# Patient Record
Sex: Female | Born: 1990 | Race: White | Hispanic: No | Marital: Married | Smoking: Never smoker
Health system: Southern US, Community
[De-identification: ages and names within clinical notes are randomized; demographics above are authoritative.]

## PROBLEM LIST (undated history)

## (undated) DIAGNOSIS — R87619 Unspecified abnormal cytological findings in specimens from cervix uteri: Secondary | ICD-10-CM

## (undated) DIAGNOSIS — M62838 Other muscle spasm: Secondary | ICD-10-CM

## (undated) DIAGNOSIS — G43909 Migraine, unspecified, not intractable, without status migrainosus: Secondary | ICD-10-CM

## (undated) DIAGNOSIS — T7840XA Allergy, unspecified, initial encounter: Secondary | ICD-10-CM

## (undated) DIAGNOSIS — F419 Anxiety disorder, unspecified: Secondary | ICD-10-CM

## (undated) HISTORY — PX: TYMPANOSTOMY TUBE PLACEMENT: SHX32

## (undated) HISTORY — DX: Migraine, unspecified, not intractable, without status migrainosus: G43.909

## (undated) HISTORY — DX: Other muscle spasm: M62.838

## (undated) HISTORY — DX: Anxiety disorder, unspecified: F41.9

## (undated) HISTORY — PX: HERNIA REPAIR: SHX51

## (undated) HISTORY — DX: Allergy, unspecified, initial encounter: T78.40XA

## (undated) HISTORY — DX: Unspecified abnormal cytological findings in specimens from cervix uteri: R87.619

---

## 2012-08-24 ENCOUNTER — Ambulatory Visit (INDEPENDENT_AMBULATORY_CARE_PROVIDER_SITE_OTHER): Payer: BC Managed Care – PPO | Admitting: Family Medicine

## 2012-08-24 VITALS — BP 120/88 | HR 86 | Temp 98.5°F | Resp 16 | Ht 65.0 in | Wt 121.0 lb

## 2012-08-24 DIAGNOSIS — M542 Cervicalgia: Secondary | ICD-10-CM

## 2012-08-24 DIAGNOSIS — M62838 Other muscle spasm: Secondary | ICD-10-CM

## 2012-08-24 MED ORDER — MELOXICAM 15 MG PO TABS: ORAL_TABLET | ORAL | Status: DC

## 2012-08-24 MED ORDER — KETOROLAC TROMETHAMINE 30 MG/ML IJ SOLN
30.0000 mg | Freq: Once | INTRAMUSCULAR | Status: AC
  Administered 2012-08-24: 30 mg via INTRAMUSCULAR

## 2012-08-24 MED ORDER — CYCLOBENZAPRINE HCL 10 MG PO TABS: 10.0000 mg | ORAL_TABLET | Freq: Two times a day (BID) | ORAL | Status: DC | PRN

## 2012-08-24 NOTE — Progress Notes (Signed)
Chief complaint: Neck pain  History of present illness: This is a 22 year old female in no significant past medical history coming in with neck pain. Patient has had this pain now for 4 days duration. The patient does not remember any type of specific injury but is having more stress at school this. Patient says that the pain is mostly on the left side without any radiation. Patient usually goes to a chiropractor but an attempt to do this and it did not make any improvement and actually potentially make it worse. Patient denies any weakness but unfortunately cannot move her head at all. Patient denies any visual changes, any headache, having nausea vomiting or any recent illnesses.  Past Medical History  Diagnosis Date  . Allergy     Past Surgical History  Procedure Laterality Date  . Hernia repair      History reviewed. No pertinent family history.  History  Substance Use Topics  . Smoking status: Never Smoker   . Smokeless tobacco: Not on file  . Alcohol Use: Not on file   Physical exam Blood pressure 120/88, pulse 86, temperature 98.5 F (36.9 C), temperature source Oral, resp. rate 16, height 5\' 5"  (1.651 m), weight 121 lb (54.885 kg), last menstrual period 08/19/2012, SpO2 98.00%. General: No apparent distress alert and oriented x3 mood and affect normal Respiratory: Patient's speak in full sentences and does not appear short of breath Skin: Warm dry intact with no signs of infection or rash Neuro: Cranial nerves II through XII are intact, neurovascularly intact in all extremities with 2+ DTRs and 2+ pulses. Neck: Negative spurling's Restricted in all planes Very tight paraspinal musculature on left side of cervical region.  Grip strength and sensation normal in bilateral hands Strength good C4 to T1 distribution No sensory change to C4 to T1 Reflexes normal No spinous process tenderness.   Assessment: Neck spasm  Plan: Portal 30 mg IM today Home exercise program  given The patient also given a prescription for blocks again and Flexeril for one week duration. Patient will return in one week if not better. At that time I would get cervical neck x-rays the patient was now presenting with any true radiculopathy. I do think there was some other psychosocial aspect to her pain and may need to be addressed later.

## 2012-08-24 NOTE — Patient Instructions (Signed)
Very nice to meet you I in getting you the medicine called meloxicam. Take one pill daily for the next week and as needed thereafter. Stopped his medication if it hurt her stomach. I am giving you some exercises. I would like you to do these daily. I am also giving you a muscle relaxant that you can take 2 times daily but will make you sleepy.  Come back in 1 week if not better.

## 2013-01-19 ENCOUNTER — Encounter: Payer: Self-pay | Admitting: Obstetrics & Gynecology

## 2013-01-19 ENCOUNTER — Ambulatory Visit (INDEPENDENT_AMBULATORY_CARE_PROVIDER_SITE_OTHER): Payer: BC Managed Care – PPO | Admitting: Obstetrics & Gynecology

## 2013-01-19 VITALS — BP 118/80 | HR 72 | Resp 16 | Ht 63.75 in | Wt 126.4 lb

## 2013-01-19 DIAGNOSIS — Z01419 Encounter for gynecological examination (general) (routine) without abnormal findings: Secondary | ICD-10-CM

## 2013-01-19 DIAGNOSIS — Z0000 Encounter for general adult medical examination without abnormal findings: Secondary | ICD-10-CM

## 2013-01-19 DIAGNOSIS — M62838 Other muscle spasm: Secondary | ICD-10-CM

## 2013-01-19 LAB — POCT URINALYSIS DIPSTICK
Blood, UA: NEGATIVE
Glucose, UA: NEGATIVE
Ketones, UA: NEGATIVE
Urobilinogen, UA: NEGATIVE

## 2013-01-19 MED ORDER — YASMIN 28 3-0.03 MG PO TABS: 1.0000 | ORAL_TABLET | Freq: Every day | ORAL | Status: DC

## 2013-01-19 MED ORDER — CYCLOBENZAPRINE HCL 10 MG PO TABS: 10.0000 mg | ORAL_TABLET | Freq: Three times a day (TID) | ORAL | Status: DC | PRN

## 2013-01-19 NOTE — Progress Notes (Signed)
22 y.o. G0P0000 Single [place] F here for annual exam.  Doing well.  Cycles regular.  Needs to be on Branded pill.  Finished Gardisil 2011.  Reports that she is having lots of issues with neck and back muscle spasms.  Saw DO at urgent care.  Saw ortho who didn't prescribe anything else.  Has done one massage that helped.  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  Not sexually active.     Patient's last menstrual period was 01/16/2013.          Sexually active: no  The current method of family planning is OCP (estrogen/progesterone).    Exercising: yes  walking and eliptical Smoker:  no  Health Maintenance: Pap:  none History of abnormal Pap:  no MMG:  none Colonoscopy:  none BMD:   none TDaP:  2007 Screening Labs: n/a, Hb today: n/a, Urine today: negative   reports that she has never smoked. She has never used smokeless tobacco. She reports that she does not drink alcohol or use illicit drugs.  Past Medical History  Diagnosis Date  . Allergy   . Migraine   . Muscle spasm     Past Surgical History  Procedure Laterality Date  . Hernia repair    . Tympanostomy tube placement      Current Outpatient Prescriptions  Medication Sig Dispense Refill  . diphenhydrAMINE (BENADRYL) 25 MG tablet Take 25 mg by mouth every 6 (six) hours as needed for itching.      . fexofenadine (ALLEGRA) 180 MG tablet Take 180 mg by mouth as needed.      Marland Kitchen YASMIN 28 3-0.03 MG tablet       . diazepam (VALIUM) 2 MG tablet 2 mg.       No current facility-administered medications for this visit.    Family History  Problem Relation Age of Onset  . Diabetes Father   . Diabetes Paternal Grandmother   . Diabetes Maternal Grandmother   . Diabetes Other     maternal great grandmother  . Heart attack Maternal Grandfather   . Stroke Maternal Grandmother   . Dementia Maternal Grandmother   . Thyroid disease Other     maternal side  . Hypertension Father   . Hyperlipidemia Father   . Migraines Mother   .  Hyperlipidemia Mother     ROS:  Pertinent items are noted in HPI.  Otherwise, a comprehensive ROS was negative.  Exam:   BP 118/80  Pulse 72  Resp 16  Ht 5' 3.75" (1.619 m)  Wt 126 lb 6.4 oz (57.335 kg)  BMI 21.87 kg/m2  LMP 01/16/2013  Weight change: -2lbs   Height: 5' 3.75" (161.9 cm)  Ht Readings from Last 3 Encounters:  01/19/13 5' 3.75" (1.619 m)  08/24/12 5\' 5"  (1.651 m)    General appearance: alert, cooperative and appears stated age Head: Normocephalic, without obvious abnormality, atraumatic Neck: no adenopathy, supple, symmetrical, trachea midline and thyroid normal to inspection and palpation Lungs: clear to auscultation bilaterally Breasts: normal appearance, no masses or tenderness Heart: regular rate and rhythm Abdomen: soft, non-tender; bowel sounds normal; no masses,  no organomegaly Extremities: extremities normal, atraumatic, no cyanosis or edema Skin: Skin color, texture, turgor normal. No rashes or lesions Lymph nodes: Cervical, supraclavicular, and axillary nodes normal. No abnormal inguinal nodes palpated Neurologic: Grossly normal   Pelvic: External genitalia:  no lesions              Urethra:  normal appearing urethra with no masses,  tenderness or lesions              Bartholins and Skenes: normal                 Vagina: normal appearing vagina with normal color and discharge, no lesions              Cervix: no lesions              Pap taken: no Bimanual Exam:  Uterus:  normal size, contour, position, consistency, mobility, non-tender              Adnexa: normal adnexa and no mass, fullness, tenderness               Rectovaginal: Confirms               Anus:  normal sphincter tone, no lesions  A:  Well Woman with normal exam On OCP's, not sexually active Neck/shoulder spasms--voice major/opera major  P:  Starting Pap smear next year Flexeril rx to pharmacy Referral to Dr. Ricki Melayna Robarts for acupuncture RX for Yasmin to pharmacy.  No substitutes. return  annually or prn  An After Visit Summary was printed and given to the patient.

## 2013-01-19 NOTE — Patient Instructions (Signed)

## 2013-09-26 ENCOUNTER — Telehealth: Payer: Self-pay | Admitting: Obstetrics & Gynecology

## 2013-09-26 MED ORDER — YASMIN 28 3-0.03 MG PO TABS: 1.0000 | ORAL_TABLET | Freq: Every day | ORAL | Status: DC

## 2013-09-26 NOTE — Telephone Encounter (Signed)
[facility], [place], [place] Patient wants to change Yasmin to a generic form.

## 2013-09-26 NOTE — Telephone Encounter (Signed)
Dr. Hyacinth MeekerMiller, patient was on Junel and experienced depression. Was using brand only Yasmin for acne after coming off of Junel.  Patient would like to try generic Yasmin as cost of brand yasmin has increased for her. Order entered with refills until 01/2014 with [facility]. Patient will call with any side effects immediatly.  [facility] will [facility] generic and they have correct order.   Routing to provider for final review. Patient agreeable to disposition. Will close encounter

## 2013-12-01 ENCOUNTER — Telehealth: Payer: Self-pay | Admitting: Obstetrics & Gynecology

## 2013-12-01 NOTE — Telephone Encounter (Signed)
Patient would like to talk to a nurse about changing her birth control.

## 2013-12-04 MED ORDER — YASMIN 28 3-0.03 MG PO TABS: 1.0000 | ORAL_TABLET | Freq: Every day | ORAL | Status: DC

## 2013-12-04 NOTE — Telephone Encounter (Signed)
Spoke with patient. Patient was previously on brand Yasmin but wanted to switch to generic due to cost. Patient would now like to switch back to brand as she states that the generic is "Making me sick and feel off. I did not feel like this on the brand. I want to switch back." Patient was switched to generic on 4/21. Requesting that rx be sent in to [facility] [address]. Advised would make change and send medication to requested pharmacy. Patient requesting vaccine for Hep A. Advised will need to see primary care for this shot. Patient agreeable and verbalizes understanding.  Routing to provider for final review. Patient agreeable to disposition. Will close encounter.

## 2013-12-04 NOTE — Telephone Encounter (Signed)
Left message to call Kaitlyn at [phone]. 

## 2014-01-26 ENCOUNTER — Ambulatory Visit (INDEPENDENT_AMBULATORY_CARE_PROVIDER_SITE_OTHER): Payer: BC Managed Care – PPO | Admitting: Obstetrics & Gynecology

## 2014-01-26 ENCOUNTER — Encounter: Payer: Self-pay | Admitting: Obstetrics & Gynecology

## 2014-01-26 VITALS — BP 120/80 | HR 60 | Resp 16 | Ht 64.0 in | Wt 123.8 lb

## 2014-01-26 DIAGNOSIS — Z01419 Encounter for gynecological examination (general) (routine) without abnormal findings: Secondary | ICD-10-CM

## 2014-01-26 DIAGNOSIS — M62838 Other muscle spasm: Secondary | ICD-10-CM

## 2014-01-26 DIAGNOSIS — Z124 Encounter for screening for malignant neoplasm of cervix: Secondary | ICD-10-CM

## 2014-01-26 DIAGNOSIS — Z0000 Encounter for general adult medical examination without abnormal findings: Secondary | ICD-10-CM

## 2014-01-26 LAB — HEMOGLOBIN, FINGERSTICK: Hemoglobin, fingerstick: 12.6 g/dL (ref 12.0–16.0)

## 2014-01-26 MED ORDER — ELETRIPTAN HYDROBROMIDE 40 MG PO TABS: ORAL_TABLET | ORAL | Status: DC

## 2014-01-26 MED ORDER — YASMIN 28 3-0.03 MG PO TABS: 1.0000 | ORAL_TABLET | Freq: Every day | ORAL | Status: DC

## 2014-01-26 MED ORDER — CYCLOBENZAPRINE HCL 10 MG PO TABS: 10.0000 mg | ORAL_TABLET | Freq: Three times a day (TID) | ORAL | Status: DC | PRN

## 2014-01-26 NOTE — Progress Notes (Addendum)
23 y.o. G0P0000 Single [place] F here for annual exam.  Going to [place] abroad.  Leaving in early September.   Cycles are regular.  No STD concerns.  Declines STD testing.  Needs RFs for Relpax and Flexeril for studying.  Patient's last menstrual period was 12/25/2013.          Sexually active: Yes.    The current method of family planning is OCP (estrogen/progesterone).    Exercising: Yes.    cardio Smoker:  no  Health Maintenance: Pap:  none History of abnormal Pap:  no MMG:  none Colonoscopy:  none BMD:   none TDaP:  2007 Screening Labs: n/a, Hb today: 12.6, Urine today: negative   reports that she has never smoked. She has never used smokeless tobacco. She reports that she does not drink alcohol or use illicit drugs.  Past Medical History  Diagnosis Date  . Allergy   . Migraine   . Muscle spasm     Past Surgical History  Procedure Laterality Date  . Hernia repair    . Tympanostomy tube placement      Current Outpatient Prescriptions  Medication Sig Dispense Refill  . cyclobenzaprine (FLEXERIL) 10 MG tablet Take 1 tablet (10 mg total) by mouth 3 (three) times daily as needed for muscle spasms.  30 tablet  1  . diazepam (VALIUM) 2 MG tablet 2 mg.      . diphenhydrAMINE (BENADRYL) 25 MG tablet Take 25 mg by mouth every 6 (six) hours as needed for itching.      . Eletriptan Hydrobromide (RELPAX PO) Take 10 mg by mouth. As directed      . fexofenadine (ALLEGRA) 180 MG tablet Take 180 mg by mouth as needed.      Marland Kitchen. YASMIN 28 3-0.03 MG tablet Take 1 tablet by mouth daily.  3 Package  1   No current facility-administered medications for this visit.    Family History  Problem Relation Age of Onset  . Diabetes Father   . Diabetes Paternal Grandmother   . Diabetes Maternal Grandmother   . Diabetes Other     maternal great grandmother  . Heart attack Maternal Grandfather   . Stroke Maternal Grandmother   . Dementia Maternal Grandmother   . Thyroid  disease Other     maternal side  . Hypertension Father   . Hyperlipidemia Father   . Migraines Mother   . Hyperlipidemia Mother     ROS:  Pertinent items are noted in HPI.  Otherwise, a comprehensive ROS was negative.  Exam:   BP 120/80  Pulse 60  Resp 16  Ht 5\' 4"  (1.626 m)  Wt 123 lb 12.8 oz (56.155 kg)  BMI 21.24 kg/m2  LMP 12/25/2013  Weight change: -3#  Height: 5\' 4"  (162.6 cm)  Ht Readings from Last 3 Encounters:  01/26/14 5\' 4"  (1.626 m)  01/19/13 5' 3.75" (1.619 m)  08/24/12 5\' 5"  (1.651 m)    General appearance: alert, cooperative and appears stated age Head: Normocephalic, without obvious abnormality, atraumatic Neck: no adenopathy, supple, symmetrical, trachea midline and thyroid normal to inspection and palpation Lungs: clear to auscultation bilaterally Breasts: normal appearance, no masses or tenderness Heart: regular rate and rhythm Abdomen: soft, non-tender; bowel sounds normal; no masses,  no organomegaly Extremities: extremities normal, atraumatic, no cyanosis or edema Skin: Skin color, texture, turgor normal. No rashes or lesions Lymph nodes: Cervical, supraclavicular, and axillary nodes normal. No abnormal inguinal nodes palpated Neurologic: Grossly normal  Pelvic: External genitalia:  no lesions              Urethra:  normal appearing urethra with no masses, tenderness or lesions              Bartholins and Skenes: normal                 Vagina: normal appearing vagina with normal color and discharge, no lesions              Cervix: no lesions              Pap taken: Yes.   Bimanual Exam:  Uterus:  normal size, contour, position, consistency, mobility, non-tender              Adnexa: normal adnexa and no mass, fullness, tenderness               Rectovaginal: Confirms               Anus:  normal sphincter tone, no lesions  A:  Well Woman with normal exam  On OCP's Newly S.  Declines STD testing. Neck/shoulder spasms--voice major/opera major    P: Pap smear obtained. Flexeril rx to pharmacy  Seeing Dr. Ricki Fumiko Cham for accupuncture. RX for Yasmin to pharmacy. No substitutes.  Relpax rx to pharmacy.  Pt knows to call insurance for override to get RXs she needs for travel abroad/study abroad. return annually or prn  An After Visit Summary was printed and given to the patient.

## 2014-01-30 LAB — IPS PAP TEST WITH REFLEX TO HPV

## 2014-02-02 ENCOUNTER — Telehealth: Payer: Self-pay

## 2014-02-02 NOTE — Telephone Encounter (Signed)
Message copied by Elisha Headland on Fri Feb 02, 2014 12:35 PM ------      Message from: Jerene Bears      Created: Thu Feb 01, 2014  8:28 AM       Inform pap LGSIL.  Needs repeat one year 08 recall.  Pt going to study abroad so if you can't get her, we may need to send a letter. ------

## 2014-02-02 NOTE — Telephone Encounter (Signed)
Lmtcb//kn 

## 2014-02-02 NOTE — Telephone Encounter (Signed)
Patient returning Kelly's call. °

## 2014-02-06 NOTE — Telephone Encounter (Signed)
9/1 lmtcb//kn 

## 2014-02-06 NOTE — Telephone Encounter (Signed)
Returning a call to Kelly °

## 2014-02-09 ENCOUNTER — Ambulatory Visit (INDEPENDENT_AMBULATORY_CARE_PROVIDER_SITE_OTHER): Payer: BC Managed Care – PPO | Admitting: Certified Nurse Midwife

## 2014-02-09 ENCOUNTER — Encounter: Payer: Self-pay | Admitting: Certified Nurse Midwife

## 2014-02-09 VITALS — BP 100/64 | HR 64 | Temp 98.1°F | Resp 16 | Ht 64.0 in | Wt 123.0 lb

## 2014-02-09 DIAGNOSIS — Z202 Contact with and (suspected) exposure to infections with a predominantly sexual mode of transmission: Secondary | ICD-10-CM

## 2014-02-09 DIAGNOSIS — N390 Urinary tract infection, site not specified: Secondary | ICD-10-CM

## 2014-02-09 DIAGNOSIS — R87612 Low grade squamous intraepithelial lesion on cytologic smear of cervix (LGSIL): Secondary | ICD-10-CM

## 2014-02-09 LAB — POCT URINALYSIS DIPSTICK
Bilirubin, UA: NEGATIVE
GLUCOSE UA: NEGATIVE
Ketones, UA: NEGATIVE
NITRITE UA: NEGATIVE
Protein, UA: NEGATIVE
UROBILINOGEN UA: NEGATIVE
pH, UA: 8

## 2014-02-09 MED ORDER — PHENAZOPYRIDINE HCL 100 MG PO TABS: 100.0000 mg | ORAL_TABLET | Freq: Three times a day (TID) | ORAL | Status: DC | PRN

## 2014-02-09 MED ORDER — NITROFURANTOIN MONOHYD MACRO 100 MG PO CAPS: 100.0000 mg | ORAL_CAPSULE | Freq: Two times a day (BID) | ORAL | Status: DC

## 2014-02-09 NOTE — Progress Notes (Signed)
23 y.o. single asian female g0p0 here with complaint of UTI, with onset  on 3-4 days ago. Patient complaining of urinary frequency/urgency/ and pain with urination. Patient feels she is not emptying well. Patient denies fever, chills, nausea or back pain. No new personal products. Patient feels not related to sexual activity. Denies any vaginal symptoms or new personal products. Contraception is OCP, consistent. Patient leaving for [place] for study abroad tomorrow. No other health issues. Patient not aware that we have been trying to contact her regarding her pap smear. Reviewed results of LSIL with patient and that due to her age no treatment is indicated except follow up with pap smear in one year very important. Patient did complete Gardasil vaccine.  Recommended to patient to do STD screening due to results. Patient requests STD screening.  O: Healthy female WDWN Affect: Normal, orientation x 3 Skin : warm and dry CVAT: negative bilateral Abdomen:positive for suprapubic tenderness, soft  Pelvic exam: External genital area: normal, no lesions Bladder,Urethra, Urethral meatus: tender Vagina: normal vaginal discharge, normal appearance  Wet prep not taken Cervix: normal, non tender Uterus:normal,non tender Adnexa: normal non tender, no fullness or masses  Poct urine-wbc 2+, rbc tr, ph-8.0 A: UTI LSIL pap STD screeening  P: Reviewed findings of UTI and need to increase water intake and need for treatment. AO:[id number] see order Rx Pyridium see order ION:GEXBM micro, culture Reviewed warning signs and symptoms of UTI Encouraged to limit soda, tea, and coffee If TOC needed patient can be seen with government health in [place]. Lab: HIV,RPR, STD panel Discussed questions regarding LSIL pap smear and transmission at length.. Given handout regarding HPV and genital wart information. Encouraged condom use for protection from STD's. Patient can be contacted per her  email. [email or web address]   RV prn

## 2014-02-09 NOTE — Patient Instructions (Signed)

## 2014-02-10 LAB — URINALYSIS, MICROSCOPIC ONLY
Casts: NONE SEEN
Crystals: NONE SEEN
SQUAMOUS EPITHELIAL / LPF: NONE SEEN

## 2014-02-10 LAB — STD PANEL
HEP B S AG: NEGATIVE
HIV: NONREACTIVE

## 2014-02-10 NOTE — Progress Notes (Signed)
Reviewed personally.  M. Suzanne Cherrill Scrima, MD.  

## 2014-02-12 LAB — URINE CULTURE

## 2014-02-13 LAB — HSV(HERPES SIMPLEX VRS) I + II AB-IGG: HSV 2 Glycoprotein G Ab, IgG: 0.34 IV

## 2014-02-15 LAB — IPS N GONORRHOEA AND CHLAMYDIA BY PCR

## 2014-02-19 NOTE — Telephone Encounter (Signed)
Patient aware of results. See result note//kn

## 2014-03-23 ENCOUNTER — Other Ambulatory Visit: Payer: Self-pay

## 2014-04-24 ENCOUNTER — Ambulatory Visit: Payer: BC Managed Care – PPO | Admitting: Obstetrics & Gynecology

## 2014-12-07 ENCOUNTER — Other Ambulatory Visit: Payer: Self-pay

## 2014-12-07 MED ORDER — SUMATRIPTAN SUCCINATE 100 MG PO TABS: 100.0000 mg | ORAL_TABLET | ORAL | Status: DC | PRN

## 2014-12-07 NOTE — Telephone Encounter (Signed)
Spoke with patient. Patient states that she has not tried any other medication for migraines before. Patient is agreeable to switch to Imitrex 100 mg po may repeat in 2 hours if headache persists. Max dose 200 mg in 24 hours. Advised will send rx to [facility] off [address] per request. Patient is agreeable.   Dr.Miller, Rx pended for #10 how many refills?

## 2014-12-07 NOTE — Telephone Encounter (Signed)
Left message to call Kaitlyn at [phone].  Need to speak with patient regarding denial of PA for Relpax. Has patient tried anything other than Relpax before? If not will need to try Imitrex 100mg  po at headache onset per Dr.Miller. Hand written note on PA denial form.

## 2014-12-07 NOTE — Telephone Encounter (Signed)
Patient returning call.

## 2015-01-07 DIAGNOSIS — R87619 Unspecified abnormal cytological findings in specimens from cervix uteri: Secondary | ICD-10-CM

## 2015-01-07 HISTORY — DX: Unspecified abnormal cytological findings in specimens from cervix uteri: R87.619

## 2015-02-04 ENCOUNTER — Ambulatory Visit (INDEPENDENT_AMBULATORY_CARE_PROVIDER_SITE_OTHER): Payer: BLUE CROSS/BLUE SHIELD | Admitting: Obstetrics & Gynecology

## 2015-02-04 ENCOUNTER — Encounter: Payer: Self-pay | Admitting: Obstetrics & Gynecology

## 2015-02-04 VITALS — BP 128/82 | HR 56 | Resp 12 | Ht 64.0 in | Wt 131.0 lb

## 2015-02-04 DIAGNOSIS — Z124 Encounter for screening for malignant neoplasm of cervix: Secondary | ICD-10-CM

## 2015-02-04 DIAGNOSIS — R0602 Shortness of breath: Secondary | ICD-10-CM | POA: Diagnosis not present

## 2015-02-04 DIAGNOSIS — Z0000 Encounter for general adult medical examination without abnormal findings: Secondary | ICD-10-CM | POA: Diagnosis not present

## 2015-02-04 DIAGNOSIS — Z202 Contact with and (suspected) exposure to infections with a predominantly sexual mode of transmission: Secondary | ICD-10-CM

## 2015-02-04 DIAGNOSIS — Z01419 Encounter for gynecological examination (general) (routine) without abnormal findings: Secondary | ICD-10-CM | POA: Diagnosis not present

## 2015-02-04 DIAGNOSIS — R55 Syncope and collapse: Secondary | ICD-10-CM | POA: Insufficient documentation

## 2015-02-04 LAB — POCT URINALYSIS DIPSTICK
BILIRUBIN UA: NEGATIVE
Blood, UA: NEGATIVE
GLUCOSE UA: NEGATIVE
Ketones, UA: NEGATIVE
Leukocytes, UA: NEGATIVE
NITRITE UA: NEGATIVE
PH UA: 6
Protein, UA: NEGATIVE
Urobilinogen, UA: NEGATIVE

## 2015-02-04 MED ORDER — PAROXETINE HCL 10 MG PO TABS: 10.0000 mg | ORAL_TABLET | ORAL | Status: DC

## 2015-02-04 MED ORDER — YASMIN 28 3-0.03 MG PO TABS: 1.0000 | ORAL_TABLET | Freq: Every day | ORAL | Status: DC

## 2015-02-04 NOTE — Progress Notes (Signed)
24 y.o. G0P0000 Single [place] F here for annual exam.  Doing well.  Has one course left before graduation.  Has a recital that she has to do.  Studies abroad last year.  Considering graduate school for psychology.  Has to do some additional undergraduate classes.    Pt requests rx for Diazepam due to increased stressors. 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  Feels it really helps.  Abuse potential discussed.  Pt would consider other options.  Pt also reports feeling increased SOB/chest tightness that she does not feel is related to stress.  Had three syncopal episodes at restaurant earlier this year.  Was just eating dinner with friend and got light headed and hot feeling.  Went to stand up and passed out.  Did this two more times before finally started to feel better.  She did not go for evaluation.    Patient's last menstrual period was 01/23/2015.          Sexually active: No.  The current method of family planning is OCP (estrogen/progesterone).    Exercising: Yes.    walking Smoker:  no  Health Maintenance: Pap:  01/26/14 LGSIL History of abnormal Pap:  yes MMG:  none Colonoscopy:  none BMD:   none TDaP:  2007 Screening Labs: today, Hb today: 13.4, Urine today: PH-6.0   reports that she has never smoked. She has never used smokeless tobacco. She reports that she drinks alcohol. She reports that she does not use illicit drugs.  Past Medical History  Diagnosis Date  . Allergy   . Migraine   . Muscle spasm     Past Surgical History  Procedure Laterality Date  . Hernia repair    . Tympanostomy tube placement      Current Outpatient Prescriptions  Medication Sig Dispense Refill  . cyclobenzaprine (FLEXERIL) 10 MG tablet Take 1 tablet (10 mg total) by mouth 3 (three) times daily as needed for muscle spasms. 30 tablet 2  . diazepam (VALIUM) 2 MG tablet 2 mg.    . diphenhydrAMINE (BENADRYL) 25 MG tablet Take 25 mg  by mouth every 6 (six) hours as needed for itching.    . eletriptan (RELPAX) 40 MG tablet One tablet by mouth at onset of headache. May repeat in 2 hours if headache persists or recurs. 21 tablet 4  . fexofenadine (ALLEGRA) 180 MG tablet Take 180 mg by mouth as needed.    . SUMAtriptan (IMITREX) 100 MG tablet Take 1 tablet (100 mg total) by mouth every 2 (two) hours as needed for migraine. May repeat in 2 hours if headache persists or recurs. 9 tablet 4  . YASMIN 28 3-0.03 MG tablet Take 1 tablet by mouth daily. 3 Package 4   No current facility-administered medications for this visit.    Family History  Problem Relation Age of Onset  . Diabetes Father   . Diabetes Paternal Grandmother   . Diabetes Maternal Grandmother   . Diabetes Other     maternal great grandmother  . Heart attack Maternal Grandfather   . Stroke Maternal Grandmother   . Dementia Maternal Grandmother   . Thyroid disease Other     maternal side  . Hypertension Father   . Hyperlipidemia Father   . Migraines Mother   . Hyperlipidemia Mother     ROS:  Pertinent items are noted in HPI.  Otherwise, a comprehensive ROS was negative.  Exam:  LMP 01/23/2015    General appearance: alert, cooperative and appears stated age Head: Normocephalic, without obvious abnormality, atraumatic Neck: no adenopathy, supple, symmetrical, trachea midline and thyroid normal to inspection and palpation Lungs: clear to auscultation bilaterally Breasts: normal appearance, no masses or tenderness Heart: regular rate and rhythm Abdomen: soft, non-tender; bowel sounds normal; no masses,  no organomegaly Extremities: extremities normal, atraumatic, no cyanosis or edema Skin: Skin color, texture, turgor normal. No rashes or lesions Lymph nodes: Cervical, supraclavicular, and axillary nodes normal. No abnormal inguinal nodes palpated Neurologic: Grossly normal   Pelvic: External genitalia:  no lesions              Urethra:  normal  appearing urethra with no masses, tenderness or lesions              Bartholins and Skenes: normal                 Vagina: normal appearing vagina with normal color and discharge, no lesions              Cervix: no lesions              Pap taken: Yes.   Bimanual Exam:  Uterus:  normal size, contour, position, consistency, mobility, non-tender              Adnexa: normal adnexa and no mass, fullness, tenderness               Rectovaginal: Confirms               Anus:  normal sphincter tone, no lesions  Chaperone was present for exam.  A:  Well Woman with normal exam  On OCP's H/O LGSIL pap smear last year Neck/shoulder spasms--voice major/opera major  Anxiety Syncopal episodes  P: Pap smear obtained again this year RX for Yasmin to pharmacy. No substitutes. 90 day supply with #4RF Pt will call when needs Imitrex prescription Referral to PCP.  May need cardiologist as well. CMP, TSH, CBC today HIV, RPR, GC/Chl.  D/w pt HSV testing (as had done last year).  CDC guidelines discussed.  Decided together not to do this. Trail of paxil  daily.  Rx to pharmacy.  Follow up in three weeks.  No depression hx. return annually or prn

## 2015-02-05 LAB — COMPREHENSIVE METABOLIC PANEL
ALK PHOS: 56 U/L (ref 33–115)
ALT: 9 U/L (ref 6–29)
AST: 14 U/L (ref 10–30)
Albumin: 4.2 g/dL (ref 3.6–5.1)
BUN: 8 mg/dL (ref 7–25)
CALCIUM: 9.6 mg/dL (ref 8.6–10.2)
CHLORIDE: 101 mmol/L (ref 98–110)
CO2: 26 mmol/L (ref 20–31)
Creat: 0.74 mg/dL (ref 0.50–1.10)
GLUCOSE: 88 mg/dL (ref 65–99)
POTASSIUM: 4 mmol/L (ref 3.5–5.3)
Sodium: 138 mmol/L (ref 135–146)
Total Bilirubin: 0.3 mg/dL (ref 0.2–1.2)
Total Protein: 7.1 g/dL (ref 6.1–8.1)

## 2015-02-05 LAB — CBC
HEMATOCRIT: 40.7 % (ref 36.0–46.0)
Hemoglobin: 13.5 g/dL (ref 12.0–15.0)
MCH: 28.5 pg (ref 26.0–34.0)
MCHC: 33.2 g/dL (ref 30.0–36.0)
MCV: 86 fL (ref 78.0–100.0)
MPV: 9.6 fL (ref 8.6–12.4)
PLATELETS: 303 10*3/uL (ref 150–400)
RBC: 4.73 MIL/uL (ref 3.87–5.11)
RDW: 13.7 % (ref 11.5–15.5)
WBC: 7.1 10*3/uL (ref 4.0–10.5)

## 2015-02-05 LAB — HEMOGLOBIN, FINGERSTICK: HEMOGLOBIN, FINGERSTICK: 13.4 g/dL (ref 12.0–16.0)

## 2015-02-05 LAB — TSH: TSH: 2.128 u[IU]/mL (ref 0.350–4.500)

## 2015-02-05 LAB — HIV ANTIBODY (ROUTINE TESTING W REFLEX): HIV: NONREACTIVE

## 2015-02-05 LAB — RPR

## 2015-02-06 ENCOUNTER — Telehealth: Payer: Self-pay | Admitting: Emergency Medicine

## 2015-02-06 DIAGNOSIS — Z7689 Persons encountering health services in other specified circumstances: Secondary | ICD-10-CM

## 2015-02-06 DIAGNOSIS — R55 Syncope and collapse: Secondary | ICD-10-CM

## 2015-02-06 LAB — IPS N GONORRHOEA AND CHLAMYDIA BY PCR

## 2015-02-06 NOTE — Telephone Encounter (Signed)
Message left to return call to Renee Wilcox at [phone].    

## 2015-02-06 NOTE — Telephone Encounter (Signed)
-----   Message from Jerene Bears, MD sent at 02/05/2015 10:49 AM EDT ----- Please inform pt HIV, RPR, CMP and TSH were negative.  Her pap, GC and Chl are still pending.  Will notify her of these results when back.  Pt reported syncopal episode without having any evaluation as well as some SOB and chest tightness at times.  Having anxiety so I started her on Paxil yesterday.  Needs PCP evaluation.  Does not have one.  Can you call [facility] at [facility] (close to her home) and make appt for her with next available appt.  [address]  [place] , [place].  Phone: [phone]

## 2015-02-06 NOTE — Telephone Encounter (Signed)
Patient returned call and message from Dr. Hyacinth Meeker given. Patient verbalized understanding of results. Advised Pap smear and GC and chlamydia pending.  She is advised of referral from Dr. Hyacinth Meeker and is agreeable. Advised I will contact their office and call her back with an appointment and patient agreeable.   Obtained appointment and returned call. Left detailed message, okay per designated party release form of appointment. She is scheduled for 02/15/15 at 0900 arrive at 0845 to check for appointment to establish care with PCP, NP Sandford Craze as patient has been having syncopal episodes. Advised patient in message that appointment information will come via mychart as well. Advised if any questions to please return my call.   Routing to provider for final review. Patient agreeable to disposition. Will close encounter.

## 2015-02-08 LAB — IPS PAP TEST WITH REFLEX TO HPV

## 2015-02-13 ENCOUNTER — Telehealth: Payer: Self-pay | Admitting: Behavioral Health

## 2015-02-13 NOTE — Telephone Encounter (Signed)
Unable to reach patient at time of Pre-Visit Call.  Left message for patient to return call when available.    

## 2015-02-14 ENCOUNTER — Telehealth: Payer: Self-pay

## 2015-02-14 NOTE — Telephone Encounter (Signed)
Lmtcb//kn 

## 2015-02-14 NOTE — Telephone Encounter (Signed)
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  08 recall.  No colposcopy needed this year.

## 2015-02-15 ENCOUNTER — Encounter: Payer: Self-pay | Admitting: Family

## 2015-02-15 ENCOUNTER — Ambulatory Visit (HOSPITAL_BASED_OUTPATIENT_CLINIC_OR_DEPARTMENT_OTHER)
Admission: RE | Admit: 2015-02-15 | Discharge: 2015-02-15 | Disposition: A | Discharge status: Home / Self Care | Payer: BLUE CROSS/BLUE SHIELD | Source: Ambulatory Visit | Attending: Family | Admitting: Family

## 2015-02-15 ENCOUNTER — Ambulatory Visit (INDEPENDENT_AMBULATORY_CARE_PROVIDER_SITE_OTHER): Payer: BLUE CROSS/BLUE SHIELD | Admitting: Family

## 2015-02-15 VITALS — BP 118/80 | HR 68 | Temp 98.3°F | Resp 16 | Ht 64.0 in | Wt 130.0 lb

## 2015-02-15 DIAGNOSIS — R1013 Epigastric pain: Secondary | ICD-10-CM

## 2015-02-15 DIAGNOSIS — H9190 Unspecified hearing loss, unspecified ear: Secondary | ICD-10-CM

## 2015-02-15 DIAGNOSIS — R55 Syncope and collapse: Secondary | ICD-10-CM | POA: Diagnosis present

## 2015-02-15 DIAGNOSIS — F411 Generalized anxiety disorder: Secondary | ICD-10-CM

## 2015-02-15 DIAGNOSIS — R569 Unspecified convulsions: Secondary | ICD-10-CM

## 2015-02-15 DIAGNOSIS — R002 Palpitations: Secondary | ICD-10-CM

## 2015-02-15 DIAGNOSIS — G43909 Migraine, unspecified, not intractable, without status migrainosus: Secondary | ICD-10-CM | POA: Insufficient documentation

## 2015-02-15 DIAGNOSIS — J309 Allergic rhinitis, unspecified: Secondary | ICD-10-CM | POA: Diagnosis not present

## 2015-02-15 LAB — COMPREHENSIVE METABOLIC PANEL
ALBUMIN: 4 g/dL (ref 3.5–5.2)
ALT: 12 U/L (ref 0–35)
AST: 15 U/L (ref 0–37)
Alkaline Phosphatase: 55 U/L (ref 39–117)
BUN: 9 mg/dL (ref 6–23)
CALCIUM: 9.6 mg/dL (ref 8.4–10.5)
CHLORIDE: 102 meq/L (ref 96–112)
CO2: 26 mEq/L (ref 19–32)
Creatinine, Ser: 0.76 mg/dL (ref 0.40–1.20)
GFR: 100.32 mL/min (ref >60.00)
Glucose, Bld: 79 mg/dL (ref 70–99)
POTASSIUM: 4.2 meq/L (ref 3.5–5.1)
SODIUM: 135 meq/L (ref 135–145)
Total Bilirubin: 0.4 mg/dL (ref 0.2–1.2)
Total Protein: 7.1 g/dL (ref 6.0–8.3)

## 2015-02-15 LAB — CBC WITH DIFFERENTIAL/PLATELET
BASOS PCT: 0.3 % (ref 0.0–3.0)
Basophils Absolute: 0 10*3/uL (ref 0.0–0.1)
EOS PCT: 1 % (ref 0.0–5.0)
Eosinophils Absolute: 0.1 10*3/uL (ref 0.0–0.7)
HEMATOCRIT: 39.2 % (ref 36.0–46.0)
HEMOGLOBIN: 12.9 g/dL (ref 12.0–15.0)
LYMPHS PCT: 22.6 % (ref 12.0–46.0)
Lymphs Abs: 1.8 10*3/uL (ref 0.7–4.0)
MCHC: 32.9 g/dL (ref 30.0–36.0)
MCV: 85.6 fl (ref 78.0–100.0)
Monocytes Absolute: 0.3 10*3/uL (ref 0.1–1.0)
Monocytes Relative: 4.2 % (ref 3.0–12.0)
Neutro Abs: 5.9 10*3/uL (ref 1.4–7.7)
Neutrophils Relative %: 71.9 % (ref 43.0–77.0)
Platelets: 253 10*3/uL (ref 150.0–400.0)
RBC: 4.58 Mil/uL (ref 3.87–5.11)
RDW: 13.8 % (ref 11.5–15.5)
WBC: 8.2 10*3/uL (ref 4.0–10.5)

## 2015-02-15 LAB — TSH: TSH: 2.01 u[IU]/mL (ref 0.35–4.50)

## 2015-02-15 LAB — PREGNANCY, URINE: Preg Test, Ur: NEGATIVE

## 2015-02-15 LAB — D-DIMER, QUANTITATIVE (NOT AT ARMC): D DIMER QUANT: 0.28 ug{FEU}/mL (ref 0.00–0.48)

## 2015-02-15 NOTE — Progress Notes (Addendum)
Subjective:    Patient ID: Renee Wilcox, female    DOB: 21-Feb-1991, 24 y.o.   MRN: [id number]  HPI  Ms. Renee Wilcox is a 24 yr old female who presents today to establish care. She reports that 1 month ago she had a "fainting spell" which occured after eating. She reports that she "passed out in the chair"  Got up, then" passed out on table", then got up again, "passed out in bathroom."  Friend witnessed this episode and noted some convulsions. Friend caught her so she did not fall or have associated injury.  The patient reports that she did have associated urinary incontinence with this episode. Since that time she has had some dizzy spells but has not had any further LOC. Notes "sometimes my hands shake."  Notes Mom's cousin has MS and seizures.   She reports that "sometimes it feels like a giant weight on my chest, or like I have been running for a long time and just can't catch my breath. Chest pain is random and "shoots through my chest."  SOB is worse with exertion and patient notes that it is hard to do aerobic activiety.    Anxiety- on paxil x 1 week, started by Dr. Hyacinth Meeker her GYN.  She reports panic attacks related to relationships with her family members. 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    Allergic rhinits- takes allegra. This helps her symptoms.   She reports + Nausea/vomiting- Vomits infrequently, uses dramamine prn.  Notes occasional abdominal pain up under both ribs.  started LMP 8/17.  + HAs when she sings high notes  Migraines- 1-2 times a month. Imitrex helps her symptoms.  Review of Systems  Constitutional:       Wt Readings from Last 3 Encounters: 02/15/15 : 130 lb (58.968 kg) 02/04/15 : 131 lb (59.421 kg) 02/09/14 : 123 lb (55.792 kg)    HENT: Positive for rhinorrhea.        Reports that she does not hear well. Would like testign  Eyes: Negative for visual disturbance.  Respiratory: Negative for cough.   Cardiovascular: Positive for palpitations.   Gastrointestinal: Negative for diarrhea and constipation.  Genitourinary: Negative for dysuria.  Musculoskeletal:       Occasional R knee, ankle and elbow pain only on the right side.    Skin: Negative for rash.  Psychiatric/Behavioral:       Denies depression   Past Medical History  Diagnosis Date  . Allergy   . Migraine   . Muscle spasm   . Anxiety     Social History   Social History  . Marital Status: Single    Spouse Name: N/A  . Number of Children: N/A  . Years of Education: N/A   Occupational History  . Not on file.   Social History Main Topics  . Smoking status: Never Smoker   . Smokeless tobacco: Never Used  . Alcohol Use: 0.0 oz/week    0 Standard drinks or equivalent per week     Comment: once a month-if that  . Drug Use: No  . Sexual Activity:    Partners: Male    Birth Control/ Protection: Pill     Comment: Yasmin   Other Topics Concern  . 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 Hernia repair      as infant  . Tympanostomy tube placement      as child    Family History  Problem Relation Age of Onset  . Diabetes Father   . Diabetes Paternal Grandmother   . Diabetes Maternal Grandmother   . Diabetes Other     maternal great grandmother  . Heart attack Maternal Grandfather   . Stroke Maternal Grandmother   . Dementia Maternal Grandmother   . Thyroid disease Other     maternal side  . Hypertension Father   . Hyperlipidemia Father   . Migraines Mother   . Hyperlipidemia Mother     No Known Allergies  Current Outpatient Prescriptions on File Prior to Visit  Medication Sig Dispense Refill  . cyclobenzaprine (FLEXERIL) 10 MG tablet Take 1 tablet (10 mg total) by mouth 3 (three)  times daily as needed for muscle spasms. 30 tablet 2  . diphenhydrAMINE (BENADRYL) 25 MG tablet Take 25 mg by mouth every 6 (six) hours as needed for itching.    . fexofenadine (ALLEGRA) 180 MG tablet Take 180 mg by mouth as needed.    Marland Kitchen PARoxetine (PAXIL) 10 MG tablet Take 1 tablet (10 mg total) by mouth every morning. 30 tablet 2  . SUMAtriptan (IMITREX) 100 MG tablet Take 1 tablet (100 mg total) by mouth every 2 (two) hours as needed for migraine. May repeat in 2 hours if headache persists or recurs. 9 tablet 4  . YASMIN 28 3-0.03 MG tablet Take 1 tablet by mouth daily. 3 Package 4   No current facility-administered medications on file prior to visit.    BP 118/80 mmHg  Pulse 68  Temp(Src) 98.3 F (36.8 C) (Oral)  Resp 16  Ht 5\' 4"  (1.626 m)  Wt 130 lb (58.968 kg)  BMI 22.30 kg/m2  SpO2 99%  LMP 01/23/2015       Objective:   Physical Exam  Constitutional: She is oriented to person, place, and time. She appears well-developed and well-nourished.  HENT:  Head: Normocephalic and atraumatic.  Right Ear: Tympanic membrane and ear canal normal.  Left Ear: Tympanic membrane and ear canal normal.  Mouth/Throat: No oropharyngeal exudate, posterior oropharyngeal edema or posterior oropharyngeal erythema.  Eyes: EOM are normal. Pupils are equal, round, and reactive to light. No scleral icterus.  Neck: No thyromegaly present.  Cardiovascular: Normal rate, regular rhythm and normal heart sounds.   No murmur heard. Pulmonary/Chest: Effort normal and breath sounds normal. No respiratory distress. She has no wheezes.  Abdominal: Soft. She exhibits no distension. There is no tenderness. There is no rebound and no guarding.  Musculoskeletal: She exhibits no edema.  Lymphadenopathy:    She has no cervical adenopathy.  Neurological: She is alert and oriented to person, place, and time. She exhibits normal muscle tone. Coordination normal.  Skin: Skin is warm and dry.  Hypertrophic right  great toenail.   Psychiatric: She has a normal mood and affect. Her behavior is normal. Judgment and thought content normal.          Assessment & Plan:         Assessment & Plan:

## 2015-02-15 NOTE — Assessment & Plan Note (Signed)
Pain is mild. Obtain LFT, CBC- if pain persists consider abdominal imaging.

## 2015-02-15 NOTE — Assessment & Plan Note (Signed)
Notes some yawning and sleepiness on paxil. Advised pt these symptoms should improve with time.

## 2015-02-15 NOTE — Assessment & Plan Note (Signed)
Pt with history of seizure like event.  Will obtain CT head to rule out intracranial mass. Obtain cmet/cbc, tsh to rule out metabolic issues, obtain EEG, refer to neurology for further evaluation. Pt is instructed per [place] driving law- no driving until 6 months seizure free. She verbalizes understanding.

## 2015-02-15 NOTE — Telephone Encounter (Signed)
Patient returning your call.

## 2015-02-15 NOTE — Assessment & Plan Note (Signed)
Could be related to anxiety. EKG is performed today and personally reviewed.  Notes NSR but short PR interval.  Will refer to cardiology.

## 2015-02-15 NOTE — Assessment & Plan Note (Signed)
Stable on otc antihistamine

## 2015-02-15 NOTE — Progress Notes (Signed)
Pre visit review using our clinic review tool, if applicable. No additional management support is needed unless otherwise documented below in the visit note. 

## 2015-02-15 NOTE — Patient Instructions (Signed)
Please complete lab work prior to leaving. Complete CT scan on the first floor. You will be contacted about your EEG, Neurology referral and Audiology referral. I suspect you suffered a seizure. Per [place] Law- no driving until 6 months seizure free.  Go to ER if you develop recurrent seizure/loss of consciousness.  Follow up in 1 month. Welcome to [facility]!

## 2015-02-16 ENCOUNTER — Encounter: Payer: Self-pay | Admitting: Family

## 2015-02-18 NOTE — Addendum Note (Signed)
Addended by: Sandford Craze on: 02/18/2015 01:11 PM   Modules accepted: Orders

## 2015-02-18 NOTE — Telephone Encounter (Signed)
Patient notified. See result note.//kn

## 2015-02-20 ENCOUNTER — Other Ambulatory Visit: Payer: Self-pay | Admitting: Obstetrics & Gynecology

## 2015-02-20 NOTE — Telephone Encounter (Signed)
Medication refill request: Yasmin  Last AEX:  02/04/15 SM Next AEX: 05/21/16 SM  Last MMG (if hormonal medication request): None Refill authorized: 02/04/15 #3packs/ 4R to [facility]

## 2015-02-22 ENCOUNTER — Encounter: Payer: Self-pay | Admitting: Neurology

## 2015-02-22 ENCOUNTER — Ambulatory Visit (INDEPENDENT_AMBULATORY_CARE_PROVIDER_SITE_OTHER): Payer: BLUE CROSS/BLUE SHIELD | Admitting: Neurology

## 2015-02-22 VITALS — BP 128/80 | HR 92 | Resp 16 | Ht 64.0 in | Wt 129.0 lb

## 2015-02-22 DIAGNOSIS — R42 Dizziness and giddiness: Secondary | ICD-10-CM | POA: Diagnosis not present

## 2015-02-22 DIAGNOSIS — R55 Syncope and collapse: Secondary | ICD-10-CM

## 2015-02-22 NOTE — Patient Instructions (Signed)
1. Schedule routine EEG 2. Proceed with Cardiology evaluation and see what tests they would like to do. Would prioritize those before MRI brain, which we can do later on 3. As per [place] driving laws, after an episode of loss of consciousness, one should not drive until 6 months event-free 4. Follow-up in 6 weeks (after Cardiology visit)

## 2015-02-22 NOTE — Progress Notes (Signed)
NEUROLOGY CONSULTATION NOTE  DAIZY OUTEN MRN: [id number] DOB: 03/24/1991  Referring provider: Debbrah Alar, NP Primary care provider: Debbrah Alar, NP  Reason for consult:  Possible seizure  Thank you for your kind referral of [place] for consultation of the above symptoms. Although her history is well known to you, please allow me to reiterate it for the purpose of our medical record. Records and images were personally reviewed where available.  HISTORY OF PRESENT ILLNESS: This is a pleasant 24 year old right-handed woman with a history of migraines, presenting for syncopal episodes last August and dizziness. She was in her usual state of health until August, when she woke up feeling fine, met her friend for lunch and while sitting and eating, she suddenly felt hot and nauseated. She stood up to go to the bathroom then sat back down and slumped down. Her friend told her she "looked like she was having a seizure," she had urinary incontinence, then woke up and answered her friend when asked if she was okay. She then passed out again and woke up with her face on the table. She felt more conscious this time and started walking to the bathroom, when she again felt like she would pass out. She passed out a third time as she got to the stall and woke up on the floor. She had a bad migraine after, but denied any focal numbness/tingling/weakness.   She has been having brief dizzy and lightheaded episodes lasting a minute or so while standing or sitting. She has had problems with chest pain and shortness of breath for the past couple of months and will be seeing Cardiology next month. She feels her vision "shakes from side to side" for a couple of seconds. She has been more forgetful the past few months, she could not think of the word she wants to say, or stops in the middle of answering a question, because she forgets what the question was. She denies any confusion, speech  difficulties, diplopia, dysarthria, dysphagia, bowel/bladder dysfunction, palpitations. She has had migraines since childhood, occurring around 2-4 times a month, sometimes 3-4 times a week. These can be triggered by heat, singing high notes, alcohol, and muscle spasms in her neck and shoulder. She has neck and back pain for the past 1-1/2 years. Her mother has migraines. olfactory/gustatory hallucinations, deja vu, rising epigastric sensation,myoclonic jerks. Her mother's cousin had seizures. Otherwise, she had a normal birth and early development.  There is no history of febrile convulsions, CNS infections such as meningitis/encephalitis, significant traumatic brain injury, neurosurgical procedures, or family history of seizures.  I personally reviewed head CT without contrast which was normal. CBC and CMP done 02/15/15 normal. TSH normal.  PAST MEDICAL HISTORY: Past Medical History  Diagnosis Date  . Allergy   . Migraine   . Muscle spasm   . Anxiety     PAST SURGICAL HISTORY: Past Surgical History  Procedure Laterality Date  . Hernia repair      as infant  . Tympanostomy tube placement      as child    MEDICATIONS: Current Outpatient Prescriptions on File Prior to Visit  Medication Sig Dispense Refill  . cyclobenzaprine (FLEXERIL) 10 MG tablet Take 1 tablet (10 mg total) by mouth 3 (three) times daily as needed for muscle spasms. 30 tablet 2  . diphenhydrAMINE (BENADRYL) 25 MG tablet Take 25 mg by mouth every 6 (six) hours as needed for itching.    . fexofenadine (ALLEGRA) 180  MG tablet Take 180 mg by mouth as needed.    Marland Kitchen PARoxetine (PAXIL) 10 MG tablet Take 1 tablet (10 mg total) by mouth every morning. 30 tablet 2  . SUMAtriptan (IMITREX) 100 MG tablet Take 1 tablet (100 mg total) by mouth every 2 (two) hours as needed for migraine. May repeat in 2 hours if headache persists or recurs. 9 tablet 4  . YASMIN 28 3-0.03 MG tablet Take 1 tablet by mouth daily. 3 Package 4   No current  facility-administered medications on file prior to visit.    ALLERGIES: No Known Allergies  FAMILY HISTORY: Family History  Problem Relation Age of Onset  . Diabetes Father   . Diabetes Paternal Grandmother   . Diabetes Maternal Grandmother   . Diabetes Other     maternal great grandmother  . Heart attack Maternal Grandfather   . Stroke Maternal Grandmother   . Dementia Maternal Grandmother   . Thyroid disease Other     maternal side  . Hypertension Father   . Hyperlipidemia Father   . Migraines Mother   . Hyperlipidemia Mother     SOCIAL HISTORY: Social History   Social History  . Marital Status: Single    Spouse Name: N/A  . Number of Children: N/A  . Years of Education: N/A   Occupational History  . Waitress    Social History Main Topics  . Smoking status: Never Smoker   . Smokeless tobacco: Never Used  . Alcohol Use: 0.0 oz/week    0 Standard drinks or equivalent per week     Comment: Rare  . Drug Use: No  . Sexual Activity:    Partners: Male    Birth Control/ Protection: Pill     Comment: Yasmin   Other Topics Concern  . 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)   2 cats     REVIEW OF SYSTEMS: Constitutional: No fevers, chills, or sweats, no generalized fatigue, change in appetite Eyes: No visual changes, double vision, eye pain Ear, nose and throat: No hearing loss, ear pain, nasal congestion, sore throat Cardiovascular: No chest pain, palpitations Respiratory:  No shortness of breath at rest or with exertion, wheezes GastrointestinaI: No nausea, vomiting, diarrhea, abdominal pain, fecal incontinence Genitourinary:  No dysuria, urinary retention or frequency Musculoskeletal:  + neck pain, back pain Integumentary: No rash, pruritus, skin  lesions Neurological: as above Psychiatric: No depression, insomnia, anxiety Endocrine: No palpitations, fatigue, diaphoresis, mood swings, change in appetite, change in weight, increased thirst Hematologic/Lymphatic:  No anemia, purpura, petechiae. Allergic/Immunologic: no itchy/runny eyes, nasal congestion, recent allergic reactions, rashes  PHYSICAL EXAM: Filed Vitals:   02/22/15 0953  BP: 128/80  Pulse: 92  Resp: 16   General: No acute distress Head:  Normocephalic/atraumatic Eyes: Fundoscopic exam shows bilateral sharp discs, no vessel changes, exudates, or hemorrhages Neck: supple, no paraspinal tenderness, full range of motion Back: No paraspinal tenderness Heart: regular rate and rhythm Lungs: Clear to auscultation bilaterally. Vascular: No carotid bruits. Skin/Extremities: No rash, no edema Neurological Exam: Mental status: alert and oriented to person, place, and time, no dysarthria or aphasia, Fund of knowledge is appropriate.  Recent and remote memory are intact. 3/3 delayed recall. Able to spell WORLD forward and backwards. Attention and concentration are normal.  Able to name objects and repeat phrases. Cranial nerves: CN I: not tested CN II: pupils equal, round and reactive to light, visual fields intact, fundi unremarkable. CN III, IV, VI:  full range of motion, no nystagmus, no ptosis CN V: facial sensation intact CN VII: upper and lower face symmetric CN VIII: hearing intact to finger rub CN IX, X: gag intact, uvula midline CN XI: sternocleidomastoid and trapezius muscles intact CN XII: tongue midline Bulk & Tone: normal, no fasciculations. Motor: 5/5 throughout with no pronator drift. Sensation: intact to light touch, cold, pin, vibration and joint position sense.  No extinction to double simultaneous stimulation.  Romberg test negative Deep Tendon Reflexes: brisk +2 throughout, no ankle clonus, negative Hoffman sign Plantar responses: downgoing  bilaterally Cerebellar: no incoordination on finger to nose, heel to shin. No dysdiadochokinesia Gait: narrow-based and steady, able to tandem walk adequately. Tremor: none  IMPRESSION: This is a pleasant 24 year old right-handed woman with a history of migraines, presenting after she had 3 syncopal episodes in one day last August 2016. She also reports episodes of dizziness and lightheadedness, as well as forgetfulness. Neurological exam normal except for brisk reflexes throughout. Head CT unremarkable. The syncopal episodes are suggestive of vasovagal syncope, less likely seizure. She reports chest pain and shortness of breath and will be seeing Cardiology next month. A routine EEG will be ordered to assess for focal abnormalities that increase risk for recurrent seizures. Due to cost constraints, we have agreed to hold off on MRI brain for now and reconsider this after cardiac evaluation. [place] driving laws were discussed with the patient, and she knows to stop driving after an episode of loss of consciousness, until 6 months event-free. She will follow-up in 6 weeks and knows to call our office for any changes.  Thank you for allowing me to participate in the care of this patient. Please do not hesitate to call for any questions or concerns.   Ellouise Newer, M.D.

## 2015-02-26 ENCOUNTER — Encounter: Payer: Self-pay | Admitting: Family

## 2015-02-26 DIAGNOSIS — Z5181 Encounter for therapeutic drug level monitoring: Secondary | ICD-10-CM

## 2015-02-26 MED ORDER — TERBINAFINE HCL 250 MG PO TABS: 250.0000 mg | ORAL_TABLET | Freq: Every day | ORAL | Status: DC

## 2015-02-26 NOTE — Telephone Encounter (Addendum)
Spoke with pt- will send rx for lamisil for onychomycosis.  Advised pt to schedule lab visit in 6 weeks so we can repeat her liver function testing.

## 2015-02-27 ENCOUNTER — Ambulatory Visit (INDEPENDENT_AMBULATORY_CARE_PROVIDER_SITE_OTHER): Payer: BLUE CROSS/BLUE SHIELD | Admitting: Neurology

## 2015-02-27 DIAGNOSIS — R55 Syncope and collapse: Secondary | ICD-10-CM | POA: Diagnosis not present

## 2015-03-04 NOTE — Procedures (Signed)
ELECTROENCEPHALOGRAM REPORT  Date of Study: 02/27/2015  Patient's Name: Renee Wilcox MRN: [id number] Date of Birth: Dec 11, 1990  Referring Provider: Dr. Patrcia Dolly  Clinical History: This is a 24 year old woman with 3 syncopal episodes in one day last August 2016. She also reports episodes of dizziness and lightheadedness, as well as forgetfulness  Medications: Paxil, Flexeril, Imitrex  Technical Summary: A multichannel digital EEG recording measured by the international 10-20 system with electrodes applied with paste and impedances below 5000 ohms performed in our laboratory with EKG monitoring in an awake and drowsy patient.  Hyperventilation and photic stimulation were performed.  The digital EEG was referentially recorded, reformatted, and digitally filtered in a variety of bipolar and referential montages for optimal display.    Description: The patient is awake and drowsy during the recording.  During maximal wakefulness, there is a symmetric, medium voltage 10 Hz posterior dominant rhythm that attenuates with eye opening.  The record is symmetric.  During drowsiness, there is an increase in theta slowing of the background.  Deeper stages of sleep were not seen.  Hyperventilation and photic stimulation did not elicit any abnormalities.  There were no epileptiform discharges or electrographic seizures seen.    EKG lead was unremarkable.  Impression: This awake and drowsy EEG is normal.    Clinical Correlation: A normal EEG does not exclude a clinical diagnosis of epilepsy.  If further clinical questions remain, prolonged EEG may be helpful.  Clinical correlation is advised.   Patrcia Dolly, M.D.

## 2015-03-05 ENCOUNTER — Telehealth: Payer: Self-pay | Admitting: Family Medicine

## 2015-03-05 NOTE — Telephone Encounter (Signed)
-----   Message from Van Clines, MD sent at 03/04/2015 10:29 AM EDT ----- Pls let her know brain wave test was normal. Thanks

## 2015-03-05 NOTE — Telephone Encounter (Signed)
Patient was notified of result. 

## 2015-03-07 ENCOUNTER — Ambulatory Visit: Payer: BC Managed Care – PPO | Admitting: Obstetrics & Gynecology

## 2015-03-18 ENCOUNTER — Ambulatory Visit (INDEPENDENT_AMBULATORY_CARE_PROVIDER_SITE_OTHER): Payer: BLUE CROSS/BLUE SHIELD | Admitting: Cardiology

## 2015-03-18 ENCOUNTER — Other Ambulatory Visit (HOSPITAL_COMMUNITY): Payer: BLUE CROSS/BLUE SHIELD

## 2015-03-18 ENCOUNTER — Encounter: Payer: Self-pay | Admitting: Cardiology

## 2015-03-18 VITALS — BP 124/72 | HR 63 | Ht 64.0 in | Wt 128.0 lb

## 2015-03-18 DIAGNOSIS — R55 Syncope and collapse: Secondary | ICD-10-CM

## 2015-03-18 DIAGNOSIS — R002 Palpitations: Secondary | ICD-10-CM

## 2015-03-18 DIAGNOSIS — R0609 Other forms of dyspnea: Secondary | ICD-10-CM | POA: Diagnosis not present

## 2015-03-18 NOTE — Progress Notes (Signed)
Patient ID: Renee Wilcox, female   DOB: 02/22/1991, 24 y.o.   MRN: [id number]      Cardiology Office Note   Date:  03/18/2015   ID:  PRESLYNN BIER, DOB [birth year], MRN [id number]  PCP:  Nance Pear., NP  Cardiologist:  Dorothy Spark, MD   Chief complain: Palpitations, DOE, SOB, recurrent syncope.   History of Present Illness: Renee Wilcox is a 24 y.o. female, opera singingstudent with a history of migraines, presenting for syncopal episodes last August and dizziness. She was in her usual state of health until August, when she woke up feeling fine, met her friend for lunch and while sitting and eating, she suddenly felt hot and nauseated. She stood up to go to the bathroom then sat back down and slumped down. Her friend told her she "looked like she was having a seizure," she had urinary incontinence, then woke up and answered her friend when asked if she was okay. She then passed out again and woke up with her face on the table. She felt more conscious this time and started walking to the bathroom, when she again felt like she would pass out. She passed out a third time as she got to the stall and woke up on the floor. She had a bad migraine after, but denied any focal numbness/tingling/weakness.  She felt palpations while she was loosing her vision.  She also describes episodes of sudden palpitations associated with dizziness and SOB, other episodes of feeling all warm and feeling like she is going to pass out (while sitting in church). She has been experiencing years of DOE that limited her sport activities at high school.   There is no FH of SCD, mother has irregular heart beats (possibly atrial fibrillation).   Past Medical History  Diagnosis Date  . Allergy   . Migraine   . Muscle spasm   . Anxiety     Past Surgical History  Procedure Laterality Date  . Hernia repair      as infant  . Tympanostomy tube placement      as child     Current Outpatient  Prescriptions  Medication Sig Dispense Refill  . cyclobenzaprine (FLEXERIL) 10 MG tablet Take 1 tablet (10 mg total) by mouth 3 (three) times daily as needed for muscle spasms. 30 tablet 2  . diphenhydrAMINE (BENADRYL) 25 MG tablet Take 25 mg by mouth every 6 (six) hours as needed for itching.    . fexofenadine (ALLEGRA) 180 MG tablet Take 180 mg by mouth as needed.    Marland Kitchen PARoxetine (PAXIL) 10 MG tablet Take 1 tablet (10 mg total) by mouth every morning. 30 tablet 2  . SUMAtriptan (IMITREX) 100 MG tablet Take 1 tablet (100 mg total) by mouth every 2 (two) hours as needed for migraine. May repeat in 2 hours if headache persists or recurs. 9 tablet 4  . terbinafine (LAMISIL) 250 MG tablet Take 1 tablet (250 mg total) by mouth daily. 30 tablet 1  . YASMIN 28 3-0.03 MG tablet Take 1 tablet by mouth daily. 3 Package 4   No current facility-administered medications for this visit.    Allergies:   Review of patient's allergies indicates no known allergies.    Social History:  The patient  reports that she has never smoked. She has never used smokeless tobacco. She reports that she drinks alcohol. She reports that she does not use illicit drugs.   Family History:  The patient's family history includes  Dementia in her maternal grandmother; Diabetes in her father, maternal grandmother, other, and paternal grandmother; Heart attack in her maternal grandfather; Hyperlipidemia in her father and mother; Hypertension in her father; Migraines in her mother; Stroke in her maternal grandmother; Thyroid disease in her other.    ROS:  Please see the history of present illness.   Otherwise, review of systems are positive for none.   All other systems are reviewed and negative.    PHYSICAL EXAM: VS:  BP 124/72 mmHg  Pulse 63  Ht 5' 4"  (1.626 m)  Wt 128 lb (58.06 kg)  BMI 21.96 kg/m2  LMP 02/20/2015 (Exact Date) , BMI Body mass index is 21.96 kg/(m^2). GEN: Well nourished, well developed, in no acute  distress HEENT: normal Neck: no JVD, carotid bruits, or masses Cardiac: RRR; no murmurs, rubs, or gallops,no edema  Respiratory:  clear to auscultation bilaterally, normal work of breathing GI: soft, nontender, nondistended, + BS MS: no deformity or atrophy Skin: warm and dry, no rash Neuro:  Strength and sensation are intact Psych: euthymic mood, full affect  EKG:  SR, nonspecific t wave abnormalities in V1 and 2  Recent Labs: 02/15/2015: ALT 12; BUN 9; Creatinine, Ser 0.76; Hemoglobin 12.9; Platelets 253.0; Potassium 4.2; Sodium 135; TSH 2.01   Lipid Panel No results found for: CHOL, TRIG, HDL, CHOLHDL, VLDL, LDLCALC, LDLDIRECT   Wt Readings from Last 3 Encounters:  03/18/15 128 lb (58.06 kg)  02/22/15 129 lb (58.514 kg)  02/15/15 130 lb (58.968 kg)      ASSESSMENT AND PLAN:  1.  Recurrent syncope - baseline ECG normal, we will perform echocardiogram to evaluate for RV function, the most worrisome would be ARVC. We will also start a 14 day event monitor.  2. Palpitations with SOB and dizziness - start event monitor  3. SOB, DOE - for many years, evaluate echocardiogram and coronary origin  Follow up in 2 months.   Signed, Dorothy Spark, MD  03/18/2015 10:08 AM    [facility] [place], [place],   [address] Phone: [phone]; Fax: [phone]

## 2015-03-18 NOTE — Patient Instructions (Signed)
Medication Instructions:  Your physician recommends that you continue on your current medications as directed. Please refer to the Current Medication list given to you today.   Labwork: None ordered  Testing/Procedures: Your physician has requested that you have an echocardiogram. Echocardiography is a painless test that uses sound waves to create images of your heart. It provides your doctor with information about the size and shape of your heart and how well your heart's chambers and valves are working. This procedure takes approximately one hour. There are no restrictions for this procedure.  Your physician has recommended that you wear an event monitor. Event monitors are medical devices that record the heart's electrical activity. Doctors most often [place] these monitors to diagnose arrhythmias. Arrhythmias are problems with the speed or rhythm of the heartbeat. The monitor is a small, portable device. You can wear one while you do your normal daily activities. This is usually used to diagnose what is causing palpitations/syncope (passing out).    Follow-Up: Your physician recommends that you schedule a follow-up appointment in: 6 weeks   Any Other Special Instructions Will Be Listed Below (If Applicable).

## 2015-03-26 ENCOUNTER — Other Ambulatory Visit: Payer: Self-pay | Admitting: Cardiology

## 2015-03-26 DIAGNOSIS — R002 Palpitations: Secondary | ICD-10-CM

## 2015-03-26 DIAGNOSIS — R55 Syncope and collapse: Secondary | ICD-10-CM

## 2015-03-26 DIAGNOSIS — R42 Dizziness and giddiness: Secondary | ICD-10-CM

## 2015-03-27 ENCOUNTER — Other Ambulatory Visit: Payer: Self-pay

## 2015-03-27 ENCOUNTER — Ambulatory Visit (HOSPITAL_COMMUNITY): Payer: BLUE CROSS/BLUE SHIELD | Attending: Cardiology

## 2015-03-27 ENCOUNTER — Ambulatory Visit (INDEPENDENT_AMBULATORY_CARE_PROVIDER_SITE_OTHER): Payer: BLUE CROSS/BLUE SHIELD

## 2015-03-27 DIAGNOSIS — R002 Palpitations: Secondary | ICD-10-CM

## 2015-03-27 DIAGNOSIS — R55 Syncope and collapse: Secondary | ICD-10-CM

## 2015-03-27 DIAGNOSIS — Q248 Other specified congenital malformations of heart: Secondary | ICD-10-CM | POA: Diagnosis not present

## 2015-03-27 DIAGNOSIS — R42 Dizziness and giddiness: Secondary | ICD-10-CM

## 2015-03-27 DIAGNOSIS — R0609 Other forms of dyspnea: Secondary | ICD-10-CM | POA: Insufficient documentation

## 2015-04-01 ENCOUNTER — Telehealth: Payer: Self-pay | Admitting: Cardiology

## 2015-04-01 NOTE — Telephone Encounter (Signed)
Notes Recorded by Loa SocksIvy M Doniesha Landau, LPN on 16/10/960410/24/2016 at 2:55 PM Notified the pt that per Dr Delton SeeNelson her echo was normal, with normal chamber size and function, and no valvular abnormalities.  Pt verbalized understanding.

## 2015-04-01 NOTE — Telephone Encounter (Signed)
Tried to return the pts call back in regards to her recent echo results per Dr Delton SeeNelson.  Pts VM is full and unable to leave a message to call back.

## 2015-04-01 NOTE — Telephone Encounter (Signed)
Follow Up   Pt has received 3 calls. Checking the patients chart she recently had a monitor and ECHO. Maybe it is a nurse calling with results. Please assist.

## 2015-04-12 ENCOUNTER — Telehealth: Payer: Self-pay | Admitting: Family

## 2015-04-12 ENCOUNTER — Ambulatory Visit (INDEPENDENT_AMBULATORY_CARE_PROVIDER_SITE_OTHER): Payer: BLUE CROSS/BLUE SHIELD | Admitting: Family

## 2015-04-12 ENCOUNTER — Encounter: Payer: Self-pay | Admitting: Family

## 2015-04-12 ENCOUNTER — Ambulatory Visit: Payer: BLUE CROSS/BLUE SHIELD | Admitting: Neurology

## 2015-04-12 VITALS — BP 112/68 | HR 78 | Temp 98.5°F | Resp 16 | Ht 64.0 in | Wt 131.0 lb

## 2015-04-12 DIAGNOSIS — Z5181 Encounter for therapeutic drug level monitoring: Secondary | ICD-10-CM

## 2015-04-12 DIAGNOSIS — B351 Tinea unguium: Secondary | ICD-10-CM

## 2015-04-12 LAB — HEPATIC FUNCTION PANEL
ALT: 7 U/L (ref 0–35)
AST: 12 U/L (ref 0–37)
Albumin: 3.7 g/dL (ref 3.5–5.2)
Alkaline Phosphatase: 63 U/L (ref 39–117)
BILIRUBIN DIRECT: 0.1 mg/dL (ref 0.0–0.3)
BILIRUBIN TOTAL: 0.4 mg/dL (ref 0.2–1.2)
Total Protein: 6.7 g/dL (ref 6.0–8.3)

## 2015-04-12 MED ORDER — TERBINAFINE HCL 250 MG PO TABS: 250.0000 mg | ORAL_TABLET | Freq: Every day | ORAL | Status: DC

## 2015-04-12 NOTE — Assessment & Plan Note (Addendum)
Improving. Continue lamisil x 12 weeks total.  Obtain follow up LFT.

## 2015-04-12 NOTE — Progress Notes (Signed)
Subjective:    Patient ID: Renee Wilcox, female    DOB: 1990-07-04, 24 y.o.   MRN: [id number]  HPI  Renee Wilcox is a 24 yr old female who presents today for follow up on her onychomycosis.  She began lamisil in the end of September.  She reports that the new growth on the right great toenail appears healthier.  She has no other concerns today.    Review of Systems See HPI  Past Medical History  Diagnosis Date  . Allergy   . Migraine   . Muscle spasm   . Anxiety     Social History   Social History  . Marital Status: Single    Spouse Name: N/A  . Number of Children: N/A  . Years of Education: N/A   Occupational History  . Waitress    Social History Main Topics  . Smoking status: Never Smoker   . Smokeless tobacco: Never Used  . Alcohol Use: 0.0 oz/week    0 Standard drinks or equivalent per week     Comment: Rare  . Drug Use: No  . Sexual Activity:    Partners: Male    Birth Control/ Protection: Pill     Comment: Yasmin   Other Topics Concern  . Not on file   Social History Narrative   Lives with her brother sister and parents   Attends [employer], almost done- majoring in voice/ music performance, hopes to attend grad school   Works at [employer]   2 cats     Past Surgical History  Procedure Laterality Date  . Hernia repair      as infant  . Tympanostomy tube placement      as child    Family History  Problem Relation Age of Onset  . Diabetes Father   . Diabetes Paternal Grandmother   . Diabetes Maternal Grandmother   . Diabetes Other     maternal great grandmother  . Heart attack Maternal Grandfather   . Stroke Maternal Grandmother   . Dementia Maternal Grandmother   . Thyroid disease Other     maternal side  . Hypertension Father   . Hyperlipidemia Father   . Migraines Mother   . Hyperlipidemia Mother     No Known Allergies  Current Outpatient Prescriptions on File Prior to Visit  Medication Sig Dispense  Refill  . cyclobenzaprine (FLEXERIL) 10 MG tablet Take 1 tablet (10 mg total) by mouth 3 (three) times daily as needed for muscle spasms. 30 tablet 2  . diphenhydrAMINE (BENADRYL) 25 MG tablet Take 25 mg by mouth every 6 (six) hours as needed for itching.    . fexofenadine (ALLEGRA) 180 MG tablet Take 180 mg by mouth as needed.    Marland Kitchen. PARoxetine (PAXIL) 10 MG tablet Take 1 tablet (10 mg total) by mouth every morning. 30 tablet 2  . SUMAtriptan (IMITREX) 100 MG tablet Take 1 tablet (100 mg total) by mouth every 2 (two) hours as needed for migraine. May repeat in 2 hours if headache persists or recurs. 9 tablet 4  . YASMIN 28 3-0.03 MG tablet Take 1 tablet by mouth daily. 3 Package 4   No current facility-administered medications on file prior to visit.    BP 112/68 mmHg  Pulse 78  Temp(Src) 98.5 F (36.9 C) (Oral)  Resp 16  Ht 5\' 4"  (1.626 m)  Wt 131 lb (59.421 kg)  BMI 22.47 kg/m2  SpO2 100%  LMP 03/22/2015  Objective:   Physical Exam  Constitutional: She appears well-developed and well-nourished. No distress.  Skin:  Right great toenail is thickened.  Growth at base of nail plate appears less thickened.  Psychiatric: She has a normal mood and affect. Her behavior is normal. Judgment and thought content normal.          Assessment & Plan:  Declines flu shot today

## 2015-04-12 NOTE — Progress Notes (Signed)
Pre visit review using our clinic review tool, if applicable. No additional management support is needed unless otherwise documented below in the visit note. 

## 2015-04-12 NOTE — Telephone Encounter (Signed)
Opened in error

## 2015-04-12 NOTE — Patient Instructions (Signed)
Please complete lab work prior to leaving.   

## 2015-04-14 ENCOUNTER — Encounter: Payer: Self-pay | Admitting: Family

## 2015-04-28 NOTE — Telephone Encounter (Signed)
pls contact pt re: unread message. 

## 2015-04-29 NOTE — Telephone Encounter (Signed)
Letter mailed to pt.  

## 2015-04-30 ENCOUNTER — Ambulatory Visit: Payer: BLUE CROSS/BLUE SHIELD | Admitting: Cardiology

## 2015-04-30 DIAGNOSIS — R0989 Other specified symptoms and signs involving the circulatory and respiratory systems: Secondary | ICD-10-CM

## 2015-05-01 ENCOUNTER — Encounter: Payer: Self-pay | Admitting: Cardiology

## 2015-05-11 ENCOUNTER — Other Ambulatory Visit: Payer: Self-pay | Admitting: Obstetrics & Gynecology

## 2015-05-13 NOTE — Telephone Encounter (Signed)
Medication refill request: Paxil & Yasmin ( Yasmin was filled for one year in August)  Last AEX:  02-04-15 Next AEX: 05-21-16 Last MMG (if hormonal medication request): N/A Refill authorized: please advise

## 2015-06-11 ENCOUNTER — Other Ambulatory Visit: Payer: Self-pay

## 2015-06-11 NOTE — Telephone Encounter (Signed)
Fax for 90 day supply of Paroxetine received by [facility]. I called the pharmacy to make sure they have the Rx that was sent 05/13/2015 for #30 tabs and 12 refills.  [facility] stated [facility] do have that Rx and we could disregard the fax.//kg

## 2015-07-20 ENCOUNTER — Other Ambulatory Visit: Payer: Self-pay | Admitting: Obstetrics & Gynecology

## 2015-07-22 NOTE — Telephone Encounter (Signed)
Medication refill request: Imitrex Last AEX:  02-04-15 Next AEX: 05-21-16 Last MMG (if hormonal medication request): N/A Refill authorized: please advise

## 2015-08-13 ENCOUNTER — Encounter: Payer: Self-pay | Admitting: Family

## 2015-09-03 ENCOUNTER — Encounter: Payer: Self-pay | Admitting: Obstetrics & Gynecology

## 2015-09-04 ENCOUNTER — Telehealth: Payer: Self-pay | Admitting: Emergency Medicine

## 2015-09-04 NOTE — Telephone Encounter (Signed)
Mychart message returned to patient.  Prior authorization for Brand only Yasmin sent to [employer] via covermymeds.   Patient advised will need office visit to discuss potential change to Paxil.  Patient also sent message to PCP NP Peggyann Juba'Sullivan on 08/13/15 and was advised to make office visit with PCP.   Telephone encounter routed to Dr. Hyacinth MeekerMiller.

## 2015-09-04 NOTE — Telephone Encounter (Signed)
Chief Complaint  Patient presents with  . Advice Only    Change Medication-Paxil, patient sent mychart message  . Medication Management    Prior authorization-Brand Yasmin    ----- Message ----- From: JXBJY,NWGNFAANDA,[name] Sent: 09/03/2015 1:58 PM EDT To: Annamaria BootsMILLER, MARY SUZANNE, MD Subject: Non-Urgent Medical Question  Good afternoon!  The insurance company is not covering the Yasmin at all since it's not generic but I've tried the generics and they cause problems for me. The only way they will potentially cover it is if my doctors office sent in a form. Would you be able to do that?  Also, the Paxil is taking the edge off my anxiety but suppressing my personality and depresses feeling physically as well as emotionally. Do I need to come in to talk about a different anxiety medication?  Thank you for your time,  Renee MacadamMarika

## 2015-09-04 NOTE — Telephone Encounter (Signed)
Responded to patient via mychart.  Mychart message returned to patient.  Prior authorization for Brand only Yasmin sent to [employer] via covermymeds.  Patient has tried Tri-Sprintec and feels nauseated and emotional/Depression Symptoms on Junel  Patient advised will need office visit to discuss potential change to Paxil.  Patient also sent message to PCP NP Peggyann Juba'Sullivan on 08/13/15 and was advised to make office visit with PCP.

## 2015-09-23 NOTE — Telephone Encounter (Signed)
Call to patient to advise that Yasmin coverage authorized from [facility] [employer] from 09/04/2015 to 06/07/2038.   Patient also requests office visit with Dr. Hyacinth MeekerMiller to discuss Paxil rx.  Office visit with Dr. Hyacinth MeekerMiller scheduled for 09/24/15 at 1330 to address her concerns.  Routing to provider for final review. Patient agreeable to disposition. Will close encounter.

## 2015-09-24 ENCOUNTER — Ambulatory Visit (INDEPENDENT_AMBULATORY_CARE_PROVIDER_SITE_OTHER): Payer: BLUE CROSS/BLUE SHIELD | Admitting: Obstetrics & Gynecology

## 2015-09-24 VITALS — BP 100/66 | HR 86 | Resp 16 | Ht 64.0 in | Wt 133.0 lb

## 2015-09-24 DIAGNOSIS — F411 Generalized anxiety disorder: Secondary | ICD-10-CM | POA: Diagnosis not present

## 2015-09-24 MED ORDER — SERTRALINE HCL 50 MG PO TABS: 50.0000 mg | ORAL_TABLET | Freq: Every day | ORAL | Status: DC

## 2015-09-24 NOTE — Progress Notes (Signed)
Patient ID: Renee Wilcox, female   DOB: 1990-09-29, 25 y.o.   MRN: [id number]  GYNECOLOGY  VISIT   HPI: 25 y.o. G0P0000 Single Asian female with anxiety and side effects from Paxil here to discuss options.  Pt reports she had "depressed physical and emotional sensation" since being on the Paxil.  She stopped the Paxil on her own and the decreased libido has significantly improved.  She still feels that her "personality" was depressed "like a robot" but that is improving as well.  At this point she is not back to what she considers her "baseline" but as she has only been off about two weeks.    She does feel the anxiety was improved with the paxil and it worked much better than a benzodiazepine.  She is wondering about other options.  Since stopping, her neck pain (which she feel is due to "tightness from the anxiety" is worse and she is crying more.    She has never seen anyone for therapy and when I ask about this, she states she is not interested at this time.       GYNECOLOGIC HISTORY: Patient's last menstrual period was 09/06/2015. Contraception: OCPs  Patient Active Problem List   Diagnosis Date Noted  . Onychomycosis 04/12/2015  . Faintness 02/22/2015  . Dizziness 02/22/2015  . Seizure (HCC) 02/15/2015  . Palpitations 02/15/2015  . Allergic rhinitis 02/15/2015  . Anxiety state 02/15/2015  . Abdominal pain, epigastric 02/15/2015  . Syncopal episodes 02/04/2015  . Muscle spasms of neck 01/26/2014    Past Medical History  Diagnosis Date  . Allergy   . Migraine   . Muscle spasm   . Anxiety     Past Surgical History  Procedure Laterality Date  . Hernia repair      as infant  . Tympanostomy tube placement      as child    MEDS:  Reviewed in EPIC and UTD  ALLERGIES: Review of patient's allergies indicates no known allergies.  Family History  Problem Relation Age of Onset  . Diabetes Father   . Diabetes Paternal Grandmother   . Diabetes Maternal Grandmother   .  Diabetes Other     maternal great grandmother  . Heart attack Maternal Grandfather   . Stroke Maternal Grandmother   . Dementia Maternal Grandmother   . Thyroid disease Other     maternal side  . Hypertension Father   . Hyperlipidemia Father   . Migraines Mother   . Hyperlipidemia Mother      SH:  Single, non-smoker  Review of Systems  All other systems reviewed and are negative.   PHYSICAL EXAMINATION:    BP 100/66 mmHg  Pulse 86  Resp 16  Ht  (1.626 m)  Wt 133 lb (60.328 kg)  BMI 22.82 kg/m2  LMP 09/06/2015    Physical Exam  Constitutional: She is oriented to person, place, and time. She appears well-developed and well-nourished.  Neurological: She is alert and oriented to person, place, and time.  Skin: Skin is warm and dry.  Psychiatric: She has a normal mood and affect.   Assessment: Anxiety  Plan: D/W pt options with SSRIs and SNRIs.  Would like to try one other SSRI.  Decided to start Zoloft  daily.  Pt understands dosing and possible need to increase.  If she has any libido issues, she will stop it and let me know.  Then I would consider Prestiq as an option.    Therapy recommended.  Pt states she will consider.   ~15 minutes spent with patient >50% of time was in face to face discussion of above.

## 2015-09-24 NOTE — Patient Instructions (Signed)
Please give me an update in 2-4 weeks

## 2015-09-26 ENCOUNTER — Encounter: Payer: Self-pay | Admitting: Obstetrics & Gynecology

## 2015-09-26 ENCOUNTER — Telehealth: Payer: Self-pay | Admitting: Emergency Medicine

## 2015-09-26 NOTE — Telephone Encounter (Signed)
Mychart message sent to patient. Telephone encounter created and send to Dr. Hyacinth MeekerMiller. Mychart encounter closed.

## 2015-09-26 NOTE — Telephone Encounter (Signed)
Chief Complaint  Patient presents with  . Advice Only    Patient sent mychart message with request for medical advice.    To: Renee PitaMarika Wilcox Wilcox   From: Renee Amorracy L [name], RN   Created: 09/26/2015 11:08 AM    Ms. Wilcox,   Continue taking your birth control tablets each day, do not skip any pills. You may use Wilcox back up method for birth control if you would like to, but Omnicef should not interfere with your birth control. I hope you feel better soon and please call us with any concerns.   Sincerely,  Renee Wilcox, BSN RN-BC Triage Nurse [facility] [address], [address] [place], [place] [address] [facility]: [phone]; Fax: [phone]   ----- Message ----- From: Renee Wilcox,Renee Wilcox Sent: 09/26/2015 10:44 AM EDT To: Annamaria BootsMILLER, MARY SUZANNE, MD Subject: Non-Urgent Medical Question  I'm taking Omnicef for strep. I just started the placebo week of my birth control so what is the protocol and timing for all this?

## 2015-09-26 NOTE — Telephone Encounter (Signed)
Responded to patient via mychart.  Message to Dr. Hyacinth MeekerMiller and will close encounter.

## 2015-09-29 ENCOUNTER — Encounter: Payer: Self-pay | Admitting: Obstetrics & Gynecology

## 2015-10-14 ENCOUNTER — Ambulatory Visit (INDEPENDENT_AMBULATORY_CARE_PROVIDER_SITE_OTHER): Payer: BLUE CROSS/BLUE SHIELD | Admitting: Physician Assistant

## 2015-10-14 VITALS — BP 112/78 | HR 89 | Temp 98.3°F | Resp 16 | Ht 64.0 in | Wt 135.0 lb

## 2015-10-14 DIAGNOSIS — R202 Paresthesia of skin: Secondary | ICD-10-CM

## 2015-10-14 DIAGNOSIS — M542 Cervicalgia: Secondary | ICD-10-CM

## 2015-10-14 MED ORDER — CYCLOBENZAPRINE HCL 10 MG PO TABS: 10.0000 mg | ORAL_TABLET | Freq: Three times a day (TID) | ORAL | Status: DC | PRN

## 2015-10-14 NOTE — Patient Instructions (Addendum)
It is okay to continue your Ibuprofen 800 mg three times daily for the next 7 days.  If you are not improved in 2 weeks please return for neck imaging.     IF you received an x-ray today, you will receive an invoice from [facility]. Please contact [facility] at [phone] with questions or concerns regarding your invoice.   IF you received labwork today, you will receive an invoice from [facility]. Please contact [facility] at [phone] with questions or concerns regarding your invoice.   Our billing staff will not be able to assist you with questions regarding bills from these companies.  You will be contacted with the lab results as soon as they are available. The fastest way to get your results is to activate your My Chart account. Instructions are located on the last page of this paperwork. If you have not heard from us regarding the results in 2 weeks, please contact this office.

## 2015-10-14 NOTE — Progress Notes (Signed)
10/14/2015 4:47 PM   DOB: [birth year] / MRN: [id number]  SUBJECTIVE:  Renee Wilcox is a 25 y.o. female presenting for neck pain that has been going on "for months." States that she is starting to have some numbness and tingling about the 3rd digit of the right hand however this is not persistent.  She has had similar symptoms before but never with the numbness.  States she has had similar problems before.  She has never had an image of her neck taken. She has no history of neck trauma.  She has been taking Ibuprofen 800 mg qd to bid with some relief of neck pain.   She has No Known Allergies.   She  has a past medical history of Allergy; Migraine; Muscle spasm; and Anxiety.    She  reports that she has never smoked. She has never used smokeless tobacco. She reports that she drinks alcohol. She reports that she does not use illicit drugs. She  reports that she does not currently engage in sexual activity but has had female partners. She reports using the following method of birth control/protection: Pill. The patient  has past surgical history that includes Hernia repair and Tympanostomy tube placement.  Her family history includes Dementia in her maternal grandmother; Diabetes in her father, maternal grandmother, other, and paternal grandmother; Heart attack in her maternal grandfather; Hyperlipidemia in her father and mother; Hypertension in her father; Migraines in her mother; Stroke in her maternal grandmother; Thyroid disease in her other.  Review of Systems  Constitutional: Negative for fever and chills.  Cardiovascular: Negative for chest pain.  Gastrointestinal: Negative for nausea.  Genitourinary: Negative for dysuria.  Musculoskeletal: Positive for myalgias and neck pain. Negative for back pain and falls.  Skin: Negative for itching and rash.  Neurological: Negative for dizziness and headaches.    Problem list and medications reviewed and updated by myself where necessary, and exist  elsewhere in the encounter.   OBJECTIVE:  BP 112/78 mmHg  Pulse 89  Temp(Src) 98.3 F (36.8 C) (Oral)  Resp 16  Ht 5\' 4"  (1.626 m)  Wt 135 lb (61.236 kg)  BMI 23.16 kg/m2  SpO2 100%  LMP 09/25/2015  Physical Exam  Constitutional: She is oriented to person, place, and time. She appears well-nourished. No distress.  Eyes: EOM are normal. Pupils are equal, round, and reactive to light.  Cardiovascular: Normal rate and regular rhythm.   Pulmonary/Chest: Effort normal and breath sounds normal.  Abdominal: She exhibits no distension.  Musculoskeletal:       Cervical back: She exhibits tenderness, pain and spasm. She exhibits normal range of motion, no bony tenderness and no swelling.       Back:  Neurological: She is alert and oriented to person, place, and time. She has normal strength. No cranial nerve deficit or sensory deficit. Gait normal. GCS eye subscore is 4. GCS verbal subscore is 5. GCS motor subscore is 6.  Reflex Scores:      Tricep reflexes are 2+ on the right side and 2+ on the left side.      Bicep reflexes are 2+ on the right side.      Brachioradialis reflexes are 2+ on the right side and 2+ on the left side.      Patellar reflexes are 2+ on the right side and 2+ on the left side. Skin: Skin is dry. She is not diaphoretic.  Psychiatric: She has a normal mood and affect.  Vitals reviewed.  No results found for this or any previous visit (from the past 72 hour(s)).  No results found.  ASSESSMENT AND PLAN  Renee Wilcox was seen today for spasms.  Diagnoses and all orders for this visit:  Cervicalgia: Neurologically intact.  She has used flexeril with good relief in the past.  Will try her with that for now and advised that she continue her Ibuprofen 800 mg TID for the next 7 days.  If no improvement with get an image of her neck.  -     cyclobenzaprine (FLEXERIL) 10 MG tablet; Take 1 tablet (10 mg total) by mouth 3 (three) times daily as needed for muscle  spasms.  Paresthesia of right upper extremity: See problem 1.     The patient was advised to call or return to clinic if she does not see an improvement in symptoms or to seek the care of the closest emergency department if she worsens with the above plan.   Deliah Boston, MHS, PA-C [facility] 10/14/2015 4:47 PM

## 2015-10-25 ENCOUNTER — Ambulatory Visit (INDEPENDENT_AMBULATORY_CARE_PROVIDER_SITE_OTHER): Payer: BLUE CROSS/BLUE SHIELD | Admitting: Obstetrics & Gynecology

## 2015-10-25 ENCOUNTER — Encounter: Payer: Self-pay | Admitting: Obstetrics & Gynecology

## 2015-10-25 VITALS — BP 100/64 | HR 88 | Resp 16 | Ht 63.25 in | Wt 134.2 lb

## 2015-10-25 DIAGNOSIS — Z23 Encounter for immunization: Secondary | ICD-10-CM

## 2015-10-25 DIAGNOSIS — Z124 Encounter for screening for malignant neoplasm of cervix: Secondary | ICD-10-CM | POA: Diagnosis not present

## 2015-10-25 DIAGNOSIS — Z833 Family history of diabetes mellitus: Secondary | ICD-10-CM | POA: Diagnosis not present

## 2015-10-25 DIAGNOSIS — Z01419 Encounter for gynecological examination (general) (routine) without abnormal findings: Secondary | ICD-10-CM | POA: Diagnosis not present

## 2015-10-25 DIAGNOSIS — M542 Cervicalgia: Secondary | ICD-10-CM

## 2015-10-25 DIAGNOSIS — Z202 Contact with and (suspected) exposure to infections with a predominantly sexual mode of transmission: Secondary | ICD-10-CM | POA: Diagnosis not present

## 2015-10-25 DIAGNOSIS — Z0000 Encounter for general adult medical examination without abnormal findings: Secondary | ICD-10-CM | POA: Diagnosis not present

## 2015-10-25 LAB — POCT URINALYSIS DIPSTICK
BILIRUBIN UA: NEGATIVE
Blood, UA: NEGATIVE
Glucose, UA: NEGATIVE
Ketones, UA: NEGATIVE
LEUKOCYTES UA: NEGATIVE
NITRITE UA: NEGATIVE
PH UA: 6
PROTEIN UA: NEGATIVE
Urobilinogen, UA: NEGATIVE

## 2015-10-25 MED ORDER — DROSPIRENONE-ETHINYL ESTRADIOL 3-0.03 MG PO TABS: 1.0000 | ORAL_TABLET | Freq: Every day | ORAL | Status: DC

## 2015-10-25 MED ORDER — DULOXETINE HCL 30 MG PO CPEP
30.0000 mg | ORAL_CAPSULE | Freq: Every day | ORAL | Status: DC
Start: 2015-10-25 — End: 2017-06-10

## 2015-10-25 MED ORDER — DROSPIRENONE-ETHINYL ESTRADIOL 3-0.03 MG PO TABS
1.0000 | ORAL_TABLET | Freq: Every day | ORAL | Status: DC
Start: 2015-10-25 — End: 2015-10-25

## 2015-10-25 MED ORDER — SUMATRIPTAN SUCCINATE 100 MG PO TABS: 100.0000 mg | ORAL_TABLET | ORAL | Status: DC | PRN

## 2015-10-25 MED ORDER — CYCLOBENZAPRINE HCL 10 MG PO TABS: 10.0000 mg | ORAL_TABLET | Freq: Three times a day (TID) | ORAL | Status: DC | PRN

## 2015-10-25 NOTE — Progress Notes (Signed)
25 y.o. G0P0000 SingleAsianF here for annual exam.  Reports she is feeling much irritable since starting the Zoloft.  Was on Paxil and this really helped anxiety but had significant libido issues.  Pt would like to try another option.  We discussed trial of SNRI if had side effects with another SSRI.    Cycles are regular but Yasmin is quite costly for her.  She was unaware there is a coupon.  Directions for activation, web site information given to pt.  Advised to please call if has issues.   She is going to [place] for 6 -12 months with boyfriend who is studying there.  He is working on a PhD in Media planner.    Patient's last menstrual period was 09/25/2015.          Sexually active: Yes.    The current method of family planning is OCP (estrogen/progesterone).    Exercising: Yes.    cardio Smoker:  no  Health Maintenance: Pap: 02/04/2015 ASCUS, HR HPV detected   History of abnormal Pap:  yes MMG:  none Colonoscopy:  none BMD:   none TDaP:  2007.  Aware this is due. Screening Labs: drawn today, Hb today: 11.9, Urine today: normal    reports that she has never smoked. She has never used smokeless tobacco. She reports that she drinks alcohol. She reports that she does not use illicit drugs.  Past Medical History  Diagnosis Date  . Allergy   . Migraine   . Muscle spasm   . Anxiety     Past Surgical History  Procedure Laterality Date  . Hernia repair      as infant  . Tympanostomy tube placement      as child    Current Outpatient Prescriptions  Medication Sig Dispense Refill  . cyclobenzaprine (FLEXERIL) 10 MG tablet Take 1 tablet (10 mg total) by mouth 3 (three) times daily as needed for muscle spasms. 30 tablet 0  . diphenhydrAMINE (BENADRYL) 25 MG tablet Take 25 mg by mouth every 6 (six) hours as needed for itching. Reported on 10/14/2015    . fexofenadine (ALLEGRA) 180 MG tablet Take 180 mg by mouth as needed. Reported on 10/14/2015    . sertraline (ZOLOFT) 50 MG tablet Take  1 tablet (50 mg total) by mouth daily. 30 tablet 2  . SUMAtriptan (IMITREX) 100 MG tablet TAKE 1 TAB BY MOUTH EVERY 2 HRS AS NEEDED FOR MIGRAINE. MAY REPEAT IN 2 HRS IF HEADACHE PERSISTS 9 tablet 4  . YASMIN 28 3-0.03 MG tablet TAKE ONE TABLET BY MOUTH ONE TIME DAILY 84 tablet 3   No current facility-administered medications for this visit.    Family History  Problem Relation Age of Onset  . Diabetes Father   . Diabetes Paternal Grandmother   . Diabetes Maternal Grandmother   . Diabetes Other     maternal great grandmother  . Heart attack Maternal Grandfather   . Stroke Maternal Grandmother   . Dementia Maternal Grandmother   . Thyroid disease Other     maternal side  . Hypertension Father   . Hyperlipidemia Father   . Migraines Mother   . Hyperlipidemia Mother     ROS:  Pertinent items are noted in HPI.  Otherwise, a comprehensive ROS was negative.  Exam:   Filed Vitals:   10/25/15 1339  BP: 100/64  Pulse: 88  Resp: 16   General appearance: alert, cooperative and appears stated age Head: Normocephalic, without obvious abnormality, atraumatic Neck: no adenopathy,  supple, symmetrical, trachea midline and thyroid normal to inspection and palpation Lungs: clear to auscultation bilaterally Breasts: normal appearance, no masses or tenderness Heart: regular rate and rhythm Abdomen: soft, non-tender; bowel sounds normal; no masses,  no organomegaly Extremities: extremities normal, atraumatic, no cyanosis or edema Skin: Skin color, texture, turgor normal. No rashes or lesions Lymph nodes: Cervical, supraclavicular, and axillary nodes normal. No abnormal inguinal nodes palpated Neurologic: Grossly normal   Pelvic: External genitalia:  no lesions              Urethra:  normal appearing urethra with no masses, tenderness or lesions              Bartholins and Skenes: normal                 Vagina: normal appearing vagina with normal color and discharge, no lesions               Cervix: no lesions              Pap taken: Yes.   Bimanual Exam:  Uterus:  normal size, contour, position, consistency, mobility, non-tender              Adnexa: normal adnexa and no mass, fullness, tenderness               Rectovaginal: Confirms               Anus:  normal sphincter tone, no lesions  Chaperone was present for exam.  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  Reflex HR HPV if ASCUS or greater RX for Yasmin to pharmacy. No substitutes. 1 month supply/80F.  Advised pt to try and use coupon for cost savings.  Also provided instruction to pt regarding override for her to get prescription for several months.  She will need to speak with pharmacist.  Voiced understanding. Imitrex 100mg  po at headache onset x 1, repeat 2 hours.  Max 200mg Derl Barrow/24hr.  Pt states rx is for #12.  RF provided. Flexeril 10mg  po x 1 repeat q 8 hours as needed for neck spasm related to anxiety.  #1RF. Trial of cymbalta 30mg  daily.  #30/1RF.  Pt will need to call and give update before she leaves country if needs/desires RF.  Side effects reviewed with pt. Tdap today HIV, RPR, HepBSAg, GC/Chl. HbA1C Return annually or prn  In addition to annual gynecological exam, additional 10 minutes spent on treatment for anxiety, side effects, reasons to call.  Patient received Tdap injection at office visit. Injection was given in right deltoid. Patient tolerated injection well.

## 2015-10-26 LAB — STD PANEL
HEP B S AG: NEGATIVE
HIV 1&2 Ab, 4th Generation: NONREACTIVE

## 2015-10-26 LAB — HEMOGLOBIN A1C
HEMOGLOBIN A1C: 5.5 % (ref <5.7)
Mean Plasma Glucose: 111 mg/dL

## 2015-10-30 LAB — IPS N GONORRHOEA AND CHLAMYDIA BY PCR

## 2015-10-31 LAB — IPS PAP TEST WITH REFLEX TO HPV

## 2015-11-05 ENCOUNTER — Telehealth: Payer: Self-pay | Admitting: *Deleted

## 2015-11-05 DIAGNOSIS — IMO0002 Reserved for concepts with insufficient information to code with codable children: Secondary | ICD-10-CM

## 2015-11-05 MED ORDER — FLUCONAZOLE 150 MG PO TABS: ORAL_TABLET | ORAL | Status: DC

## 2015-11-05 NOTE — Telephone Encounter (Signed)
-----   Message from Jerene BearsMary S Miller, MD sent at 11/01/2015  7:17 AM EDT ----- Please inform pt that her pap was ASCUS.  The hpv testing was negative this year.  That is a positive change.  Still, with hx of abnormal, she needs repeat colpo.  [place] so I think this is a good thing.  There was yeast on the pap smear.  I would like to treat with diflucan 150mg  po x 1, repeat 72 hours before colposcopy if possible.  #2/0RF.  CC:  Arnold LongEmily Fard Borunda, RN

## 2015-11-05 NOTE — Telephone Encounter (Signed)
Return call to patient. Advised of pap results, recommendation for treatment of yeast infection followed by colposcopy. Review of colposcopy procedure provided. Patient on oral contraceptive pills, currently on menses now.  Patient scheduled for colposcopy on Monday 11-11-15. Will take Diflucan now and repeat Friday, then procedure on Monday. Patient instructed to take Motrin 800 mg one hour prior with food.  Routing to provider for final review. Patient agreeable to disposition. Will close encounter.

## 2015-11-05 NOTE — Telephone Encounter (Signed)
Patient returned call

## 2015-11-11 ENCOUNTER — Encounter: Payer: Self-pay | Admitting: Obstetrics & Gynecology

## 2015-11-11 ENCOUNTER — Ambulatory Visit (INDEPENDENT_AMBULATORY_CARE_PROVIDER_SITE_OTHER): Payer: BLUE CROSS/BLUE SHIELD | Admitting: Obstetrics & Gynecology

## 2015-11-11 DIAGNOSIS — IMO0002 Reserved for concepts with insufficient information to code with codable children: Secondary | ICD-10-CM

## 2015-11-11 DIAGNOSIS — R896 Abnormal cytological findings in specimens from other organs, systems and tissues: Secondary | ICD-10-CM | POA: Diagnosis not present

## 2015-11-11 NOTE — Patient Instructions (Signed)

## 2015-11-11 NOTE — Progress Notes (Signed)
25 y.o. Single Asian female here for colposcopy with possible biopsies and/or ECC due to ASUCS Pap with neg HR HPV obtained 10/25/15.  Pt has hx of ASCUS pap with +HR HPV 8/16 and LGSIL pap 8/15.  She is going to be living out of the country for a year and will leave in about a month.  Patient's last menstrual period was 11/01/2015.          Sexually active: Yes.    The current method of family planning is OCP (estrogen/progesterone).     Patient has been counseled about results and procedure.  Risks and benefits have bene reviewed including immediate and/or delayed bleeding, infection, cervical scaring from procedure, possibility of needing additional follow up as well as treatment.  rare risks of missing a lesion discussed as well.  All questions answered.  Pt ready to proceed.  BP 110/76 mmHg  Pulse 88  Resp 16  Ht 5' 3.25" (1.607 m)  Wt 136 lb (61.689 kg)  BMI 23.89 kg/m2  LMP 11/01/2015  Physical Exam  Genitourinary:      Speculum placed.  3% acetic acid applied to cervix for >45 seconds.  Cervix visualized with both 7.5X and 15X magnification.  Green filter also used.  Lugols solution was not used.  Findings:  Small areas of AWE at 12 and 6 o'clock.  Biopsy:  6 and 12.  ECC:  was not performed as TZ is ectocervical and easily visualized.  Monsel's was needed.  Excellent hemostasis was present.  Pt tolerated procedure well and all instruments were removed.  Findings noted above on picture of cervix.  Assessment:  ASCUS pap in pt with prior hx of ASCUS with +HR HPV and LGSIL who is going to be living [place] for a [place]  Plan:  Pathology results will be called to patient and follow-up planned pending results.

## 2015-11-14 ENCOUNTER — Ambulatory Visit (HOSPITAL_BASED_OUTPATIENT_CLINIC_OR_DEPARTMENT_OTHER)
Admission: RE | Admit: 2015-11-14 | Discharge: 2015-11-14 | Disposition: A | Discharge status: Home / Self Care | Payer: BLUE CROSS/BLUE SHIELD | Source: Ambulatory Visit | Attending: Physician Assistant | Admitting: Physician Assistant

## 2015-11-14 ENCOUNTER — Other Ambulatory Visit: Payer: Self-pay | Admitting: Physician Assistant

## 2015-11-14 ENCOUNTER — Ambulatory Visit (INDEPENDENT_AMBULATORY_CARE_PROVIDER_SITE_OTHER): Payer: BLUE CROSS/BLUE SHIELD | Admitting: Physician Assistant

## 2015-11-14 VITALS — BP 118/80 | HR 79 | Temp 97.9°F | Resp 15 | Ht 63.25 in | Wt 138.8 lb

## 2015-11-14 DIAGNOSIS — R112 Nausea with vomiting, unspecified: Secondary | ICD-10-CM

## 2015-11-14 DIAGNOSIS — G44319 Acute post-traumatic headache, not intractable: Secondary | ICD-10-CM | POA: Insufficient documentation

## 2015-11-14 LAB — POCT URINE PREGNANCY: PREG TEST UR: NEGATIVE

## 2015-11-14 MED ORDER — NAPROXEN SODIUM 550 MG PO TABS
550.0000 mg | ORAL_TABLET | Freq: Two times a day (BID) | ORAL | Status: DC
Start: 2015-11-14 — End: 2019-07-07

## 2015-11-14 MED ORDER — CYCLOBENZAPRINE HCL 10 MG PO TABS: 10.0000 mg | ORAL_TABLET | Freq: Three times a day (TID) | ORAL | Status: DC | PRN
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
## 2015-11-14 NOTE — Patient Instructions (Addendum)
You are to go over to [facility] now for your [facility] scan.  Address: [address], [place], [place] [address].  Phone: [phone]      IF you received an x-ray today, you will receive an invoice from [facility]. Please contact [facility] at [phone] with questions or concerns regarding your invoice.   IF you received labwork today, you will receive an invoice from [facility]. Please contact [facility] at [phone] with questions or concerns regarding your invoice.   Our billing staff will not be able to assist you with questions regarding bills from these companies.  You will be contacted with the lab results as soon as they are available. The fastest way to get your results is to activate your My Chart account. Instructions are located on the last page of this paperwork. If you have not heard from us regarding the results in 2 weeks, please contact this office.

## 2015-11-18 NOTE — Addendum Note (Signed)
Addended by: Jerene BearsMILLER, Ivionna Verley S on: 11/18/2015 05:05 PM   Modules accepted: Orders

## 2015-11-21 LAB — IPS OTHER TISSUE BIOPSY

## 2015-11-22 ENCOUNTER — Telehealth: Payer: Self-pay | Admitting: *Deleted

## 2015-11-22 NOTE — Telephone Encounter (Signed)
Call to patient. Per ROI, can leave detailed message on cell number. Left message with all result information from Dr Edward JollySilva.  Patient actually called back back immediately before message completed. Spoke to patient regarding LGSIL, no treatment needed and stressed importance of follow-up in one year.  Patient is moving to [place] so is not sure where she will be for annual next year. Verbalized importance of follow-up. 08 recall entered.  Encounter closed.

## 2015-11-22 NOTE — Telephone Encounter (Signed)
-----   Message from Patton SallesBrook E Amundson C Silva, MD sent at 11/22/2015  4:47 AM EDT ----- Please report colposcopy biopsy results to patient showing LGSIL.  There is no sign of cancer.  No treatment indicated.  Recall - 08.

## 2016-05-21 ENCOUNTER — Ambulatory Visit: Payer: BLUE CROSS/BLUE SHIELD | Admitting: Obstetrics & Gynecology

## 2016-06-30 ENCOUNTER — Other Ambulatory Visit: Payer: Self-pay | Admitting: Obstetrics & Gynecology

## 2016-06-30 NOTE — Telephone Encounter (Signed)
Medication refill request: Drospirenone-Ethinyl Estradiol Last AEX:  10/25/15 SM Next AEX: Not scheduled  Last MMG (if hormonal medication request): n/a Refill authorized: 10/25/15 #1 12R. Please advise. Thank you.   Spoke to [facility]ist at [facility] and they stated that patient had rx transferred out in October and then transferred it back to them in December. They filled the last refill on 06/04/16. Patient picked it up on 06/05/16.

## 2016-06-30 NOTE — Telephone Encounter (Deleted)
Medication refill request: Drospirenone-Ethinyl Estradiol Last AEX:  10/25/15 SM Next AEX: Not scheduled Last MMG (if hormonal medication request): n/a Refill authorized: 10/25/15 #1 12R. Please advise. Thank you.

## 2016-10-27 ENCOUNTER — Telehealth: Payer: Self-pay | Admitting: *Deleted

## 2016-10-27 NOTE — Telephone Encounter (Signed)
Patient is in 08 recall for 10/2016. Tried to contact [facility]inet via cell phone however number was out of service. Tried to contact via home number and the voice mail is full. Previous note in patients chart (11-22-15) says she was moving to the [place]. Please advise on recall status/letter Thanks

## 2016-11-06 NOTE — Telephone Encounter (Signed)
I think we should try to send a letter as well.  It will likely be returned to us but maybe she has mailed forwarded to some location and will receive the letter.  Please write one, I'll sign and then remove from recall.

## 2016-11-09 ENCOUNTER — Encounter: Payer: Self-pay | Admitting: *Deleted

## 2016-11-09 NOTE — Telephone Encounter (Signed)
Letter sent - removed from recall -eh 

## 2017-02-26 ENCOUNTER — Ambulatory Visit: Payer: BLUE CROSS/BLUE SHIELD | Admitting: Obstetrics & Gynecology

## 2017-06-10 ENCOUNTER — Ambulatory Visit: Payer: BLUE CROSS/BLUE SHIELD | Admitting: Obstetrics & Gynecology

## 2017-06-10 ENCOUNTER — Encounter: Payer: Self-pay | Admitting: Obstetrics & Gynecology

## 2017-06-10 ENCOUNTER — Other Ambulatory Visit (HOSPITAL_COMMUNITY)
Admission: RE | Admit: 2017-06-10 | Discharge: 2017-06-10 | Disposition: A | Discharge status: Home / Self Care | Payer: BLUE CROSS/BLUE SHIELD | Source: Ambulatory Visit | Attending: Obstetrics & Gynecology | Admitting: Obstetrics & Gynecology

## 2017-06-10 ENCOUNTER — Other Ambulatory Visit: Payer: Self-pay

## 2017-06-10 VITALS — BP 108/70 | HR 70 | Resp 14 | Ht 64.5 in | Wt 159.0 lb

## 2017-06-10 DIAGNOSIS — Z124 Encounter for screening for malignant neoplasm of cervix: Secondary | ICD-10-CM

## 2017-06-10 DIAGNOSIS — Z01419 Encounter for gynecological examination (general) (routine) without abnormal findings: Secondary | ICD-10-CM

## 2017-06-10 MED ORDER — SUMATRIPTAN SUCCINATE 100 MG PO TABS: 100.0000 mg | ORAL_TABLET | ORAL | 12 refills | Status: DC | PRN

## 2017-06-10 MED ORDER — DROSPIRENONE-ETHINYL ESTRADIOL 3-0.03 MG PO TABS: 1.0000 | ORAL_TABLET | Freq: Every day | ORAL | 4 refills | Status: DC

## 2017-06-10 MED ORDER — YASMIN 28 3-0.03 MG PO TABS: 1.0000 | ORAL_TABLET | Freq: Every day | ORAL | 4 refills | Status: DC

## 2017-06-10 NOTE — Progress Notes (Signed)
[facility] [address] Spoke with Sheralyn Boatmanoni who will cancel generic yasmin prescription and keep brand only per Dr. Hyacinth MeekerMiller.

## 2017-06-10 NOTE — Progress Notes (Signed)
27 y.o. G0P0000 SingleAsianF here for annual exam.  Pt lived in [place] for the the last year.  Came back to [place] in September.  Had Nexplanon for 8 months.  Had removed in October.  Did have a cycle in November.  Has not cycles since November.    Had increased acne and anxiety with this.    Significant other finished his Master's Degree and is teaching at [employer].  She is working at [employer].  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    Patient's last menstrual period was 05/05/2017.          Sexually active: Yes.    The current method of family planning is none.    Exercising: No.  The patient does not participate in regular exercise at present. Smoker:  no  Health Maintenance: Pap:  10/25/15 ASCUS. HR HPV:Neg. Colpo: LGSIL  02/04/15 ASCUS. HR HPV: +Detected.  History of abnormal Pap:  yes MMG:  Never TDaP:  2017 Gardasil: done  Screening Labs: discuss with provider, Hb today: discuss with provider, Urine today: not collected    reports that  has never smoked. she has never used smokeless tobacco. She reports that she drinks alcohol. She reports that she does not use drugs.  Past Medical History:  Diagnosis Date  . Abnormal Pap smear of cervix 01/2015   ASCUS. HR HPV:+  . Allergy   . Anxiety   . Migraine   . Muscle spasm     Past Surgical History:  Procedure Laterality Date  . HERNIA REPAIR     as infant  . TYMPANOSTOMY TUBE PLACEMENT     as child    Current Outpatient Medications  Medication Sig Dispense Refill  . diphenhydrAMINE (BENADRYL) 25 MG tablet Take 25 mg by mouth every 6 (six) hours as needed for itching. Reported on 10/14/2015    . fexofenadine (ALLEGRA) 180 MG tablet Take 180 mg by mouth as needed. Reported on 10/14/2015    . naproxen sodium (ANAPROX DS) 550 MG tablet Take 1 tablet (550 mg total) by mouth 2 (two) times daily with a meal. 30 tablet 0  . SUMAtriptan (IMITREX) 100 MG tablet Take 1 tablet (100 mg total) by mouth  every 2 (two) hours as needed for migraine. May repeat in 2 hours if headache persists or recurs. 12 tablet 6   No current facility-administered medications for this visit.     Family History  Problem Relation Age of Onset  . Diabetes Father   . Diabetes Paternal Grandmother   . Diabetes Maternal Grandmother   . Diabetes Other        maternal great grandmother  . Heart attack Maternal Grandfather   . Stroke Maternal Grandmother   . Dementia Maternal Grandmother   . Thyroid disease Other        maternal side  . Hypertension Father   . Hyperlipidemia Father   . Migraines Mother   . Hyperlipidemia Mother     ROS:  Pertinent items are noted in HPI.  Otherwise, a comprehensive ROS was negative.  Exam:   BP 108/70 (BP Location: Right Arm, Patient Position: Sitting, Cuff Size: Normal)   Pulse 70   Resp 14   Ht 5' 4.5" (1.638 m)   Wt 159 lb (72.1 kg)   LMP 05/05/2017   BMI 26.87 kg/m   Weight change: +25#  Height: 5' 4.5" (163.8 cm)  Ht Readings from Last 3 Encounters:  06/10/17 5' 4.5" (  1.638 m)  11/14/15 5' 3.25" (1.607 m)  11/11/15 5' 3.25" (1.607 m)    General appearance: alert, cooperative and appears stated age Head: Normocephalic, without obvious abnormality, atraumatic Neck: no adenopathy, supple, symmetrical, trachea midline and thyroid normal to inspection and palpation Lungs: clear to auscultation bilaterally Breasts: normal appearance, no masses or tenderness Heart: regular rate and rhythm Abdomen: soft, non-tender; bowel sounds normal; no masses,  no organomegaly Extremities: extremities normal, atraumatic, no cyanosis or edema Skin: Skin color, texture, turgor normal. No rashes or lesions Lymph nodes: Cervical, supraclavicular, and axillary nodes normal. No abnormal inguinal nodes palpated Neurologic: Grossly normal  Pelvic: External genitalia:  no lesions              Urethra:  normal appearing urethra with no masses, tenderness or lesions               Bartholins and Skenes: normal                 Vagina: normal appearing vagina with normal color and discharge, no lesions              Cervix: no lesions              Pap taken: Yes.   Bimanual Exam:  Uterus:  normal size, contour, position, consistency, mobility, non-tender              Adnexa: normal adnexa and no mass, fullness, tenderness               Rectovaginal: Confirms               Anus:  normal sphincter tone, no lesions  Chaperone was present for exam.  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  #3 packs/4RF RF for Imitrex 100mg  po x 1, repeat 2 hours if needed.  #12/12RF given Return annually or prn

## 2017-06-11 LAB — CYTOLOGY - PAP
Diagnosis: NEGATIVE
HPV (WINDOPATH): NOT DETECTED

## 2017-06-14 ENCOUNTER — Telehealth: Payer: Self-pay | Admitting: *Deleted

## 2017-06-14 NOTE — Telephone Encounter (Signed)
-----   Message from Jerene BearsMary S Miller, MD sent at 06/11/2017  4:49 PM EST ----- Please call pt.  I did send message through my chart that her pap was normal but she does have a hx of abnormal and needs 08 recall with pap/HR hpv in 1 year.  Please change appt.  Remove old recall and place new 08 recall.  Thanks.

## 2017-06-14 NOTE — Telephone Encounter (Signed)
LM to call back.

## 2017-06-17 NOTE — Telephone Encounter (Signed)
Left voice mail to call back 

## 2017-06-21 NOTE — Telephone Encounter (Signed)
Consulted with Dr. Hyacinth MeekerMiller. Patient has not returned calls. Ok to keep march appt.
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
## 2018-06-23 ENCOUNTER — Ambulatory Visit: Payer: 59 | Admitting: Obstetrics & Gynecology

## 2018-06-23 ENCOUNTER — Other Ambulatory Visit (HOSPITAL_COMMUNITY)
Admission: RE | Admit: 2018-06-23 | Discharge: 2018-06-23 | Disposition: A | Discharge status: Home / Self Care | Payer: 59 | Source: Ambulatory Visit | Attending: Obstetrics & Gynecology | Admitting: Obstetrics & Gynecology

## 2018-06-23 ENCOUNTER — Encounter: Payer: Self-pay | Admitting: Obstetrics & Gynecology

## 2018-06-23 VITALS — BP 110/62 | HR 74 | Resp 16 | Ht 63.75 in | Wt 141.4 lb

## 2018-06-23 DIAGNOSIS — N72 Inflammatory disease of cervix uteri: Secondary | ICD-10-CM | POA: Insufficient documentation

## 2018-06-23 DIAGNOSIS — Z124 Encounter for screening for malignant neoplasm of cervix: Secondary | ICD-10-CM

## 2018-06-23 DIAGNOSIS — B977 Papillomavirus as the cause of diseases classified elsewhere: Secondary | ICD-10-CM | POA: Diagnosis not present

## 2018-06-23 DIAGNOSIS — Z01419 Encounter for gynecological examination (general) (routine) without abnormal findings: Secondary | ICD-10-CM

## 2018-06-23 MED ORDER — YASMIN 28 3-0.03 MG PO TABS: 1.0000 | ORAL_TABLET | Freq: Every day | ORAL | 13 refills | Status: DC

## 2018-06-23 MED ORDER — CYCLOBENZAPRINE HCL 10 MG PO TABS: 10.0000 mg | ORAL_TABLET | Freq: Three times a day (TID) | ORAL | 2 refills | Status: DC | PRN

## 2018-06-23 MED ORDER — SUMATRIPTAN SUCCINATE 100 MG PO TABS: 100.0000 mg | ORAL_TABLET | ORAL | 12 refills | Status: DC | PRN

## 2018-06-23 MED ORDER — FLUOXETINE HCL 10 MG PO TABS: 10.0000 mg | ORAL_TABLET | Freq: Every day | ORAL | 12 refills | Status: DC

## 2018-06-23 NOTE — Progress Notes (Signed)
28 y.o. G0P0000 Married White or Caucasian female here for annual exam.  Thinking about starting their family in the next couple of years.  Cycles are normal.    Having issues with anxiety.  Would like to restart medication.  She has been on Paxil, Zoloft, and Cymbalta.  No LMP recorded.          Sexually active: Yes.    The current method of family planning is OCP (estrogen/progesterone).    Exercising: No.  The patient does not participate in regular exercise at present. Smoker:  no  Health Maintenance: Pap:  06/10/17 Neg. HR HPV:neg   10/25/15 ASCUS. HR HPV:neg  History of abnormal Pap:  Yes 2017 MMG:  nA Colonoscopy:  NA BMD:   NA TDaP:  10/25/15 Pneumonia vaccine(s):  no Shingrix:   no Hep C testing: NA Screening Labs: if needed    reports that she has never smoked. She has never used smokeless tobacco. She reports current alcohol use. She reports that she does not use drugs.  Past Medical History:  Diagnosis Date  . Abnormal Pap smear of cervix 01/2015   ASCUS. HR HPV:+  . Allergy   . Anxiety   . Migraine   . Muscle spasm     Past Surgical History:  Procedure Laterality Date  . HERNIA REPAIR     as infant  . TYMPANOSTOMY TUBE PLACEMENT     as child    Current Outpatient Medications  Medication Sig Dispense Refill  . diphenhydrAMINE (BENADRYL) 25 MG tablet Take 25 mg by mouth every 6 (six) hours as needed for itching. Reported on 10/14/2015    . fexofenadine (ALLEGRA) 180 MG tablet Take 180 mg by mouth as needed. Reported on 10/14/2015    . naproxen sodium (ANAPROX DS) 550 MG tablet Take 1 tablet (550 mg total) by mouth 2 (two) times daily with a meal. 30 tablet 0  . SUMAtriptan (IMITREX) 100 MG tablet Take 1 tablet (100 mg total) by mouth every 2 (two) hours as needed for migraine. May repeat in 2 hours if headache persists or recurs. 12 tablet 12  . YASMIN 28 3-0.03 MG tablet Take 1 tablet by mouth daily. Brand only please. 3 Package 4   No current  facility-administered medications for this visit.     Family History  Problem Relation Age of Onset  . Diabetes Father   . Diabetes Paternal Grandmother   . Diabetes Maternal Grandmother   . Diabetes Other        maternal great grandmother  . Heart attack Maternal Grandfather   . Stroke Maternal Grandmother   . Dementia Maternal Grandmother   . Thyroid disease Other        maternal side  . Hypertension Father   . Hyperlipidemia Father   . Migraines Mother   . Hyperlipidemia Mother     Review of Systems  Psychiatric/Behavioral:       Anxiety     Exam:   Vitals:   06/23/18 0828  BP: 110/62  Pulse: 74  Resp: 16    General appearance: alert, cooperative and appears stated age Head: Normocephalic, without obvious abnormality, atraumatic Neck: no adenopathy, supple, symmetrical, trachea midline and thyroid normal to inspection and palpation Lungs: clear to auscultation bilaterally Breasts: normal appearance, no masses or tenderness Heart: regular rate and rhythm Abdomen: soft, non-tender; bowel sounds normal; no masses,  no organomegaly Extremities: extremities normal, atraumatic, no cyanosis or edema Skin: Skin color, texture, turgor normal. No rashes or  lesions Lymph nodes: Cervical, supraclavicular, and axillary nodes normal. No abnormal inguinal nodes palpated Neurologic: Grossly normal   Pelvic: External genitalia:  no lesions              Urethra:  normal appearing urethra with no masses, tenderness or lesions              Bartholins and Skenes: normal                 Vagina: normal appearing vagina with normal color and discharge, no lesions              Cervix: no lesions              Pap taken: Yes.   Bimanual Exam:  Uterus:  normal size, contour, position, consistency, mobility, non-tender              Adnexa: normal adnexa and no mass, fullness, tenderness               Rectovaginal: Confirms               Anus:  normal sphincter tone, no  lesions  Chaperone was present for exam.  A:  Well Woman with normal exam On OCPs Anxiety with muscles spasms  P:   Mammogram guidelines reviewed.  Will start at age 53 pap smear with HR HPV obtained today No lab work obtained TDaP UTD Rx for Imitrex 100mg  po x 1, repeat 2 hours if needed.  #12/12RF Rx for branded Yasmin to pharmacy Trial of prozac 10mg  po qd.  Pt will give update in 3 weeks. Flexeril 10mg  tid Prn #30/2RF. Acupuncturist name given Return annually or prn

## 2018-06-23 NOTE — Patient Instructions (Signed)
Accupuncture:  Morrell RiddleNing Li [address] [place], [place] [address]  [phone]

## 2018-06-27 LAB — CYTOLOGY - PAP
DIAGNOSIS: NEGATIVE
HPV (WINDOPATH): NOT DETECTED

## 2018-07-15 ENCOUNTER — Other Ambulatory Visit: Payer: Self-pay | Admitting: Obstetrics & Gynecology

## 2018-07-15 NOTE — Telephone Encounter (Signed)
Request for 90-day supply of Fluoxetine from [facility]. Prescription was sent to pharmacy 07/03/18 #30 w/12 refills; Okay to switch to 90-day supply. Medication sent to pharmacy #90 w/3 refills

## 2018-08-18 ENCOUNTER — Ambulatory Visit: Payer: BLUE CROSS/BLUE SHIELD | Admitting: Obstetrics & Gynecology

## 2019-01-17 ENCOUNTER — Other Ambulatory Visit: Payer: Self-pay | Admitting: Obstetrics & Gynecology

## 2019-01-17 NOTE — Telephone Encounter (Signed)
Medication refill request: yasmin  Last AEX:  04/24/19 SM Next AEX: 07/07/19  Last MMG (if hormonal medication request): none Refill authorized: 06/23/18 #1pack/13R. To [facility]   Request coming from pharmacy in [place].  Ok to send 1 pack? Routed to [employer]. SM out of the office.

## 2019-02-10 ENCOUNTER — Other Ambulatory Visit: Payer: Self-pay | Admitting: Obstetrics and Gynecology

## 2019-02-10 NOTE — Telephone Encounter (Signed)
Medication refill request: Renee Wilcox  Last AEX: 06/23/18  Next AEX:  07/07/19 Last MMG (if hormonal medication request): NA Refill authorized:  #4 packs with 0 RF

## 2019-06-16 ENCOUNTER — Other Ambulatory Visit: Payer: Self-pay | Admitting: Obstetrics & Gynecology

## 2019-06-16 NOTE — Telephone Encounter (Signed)
Medication refill request: Brand Yasmin  Last AEX:  06-23-2018 SM  Next AEX: 06-29-19 Last MMG (if hormonal medication request): n/a Refill authorized: Today, please advise.   Medication pended for #28, 0RF. Please refill if appropriate.

## 2019-06-28 ENCOUNTER — Other Ambulatory Visit: Payer: Self-pay | Admitting: Obstetrics & Gynecology

## 2019-06-28 NOTE — Telephone Encounter (Signed)
Medication refill request: Imitrex Last AEX:  06-23-2018 SM  Next AEX: 07-07-19 Last MMG (if hormonal medication request): n/a Refill authorized: Today, please advise.   Medication pended for #12, 0RF. Please refill if appropriate.

## 2019-07-07 ENCOUNTER — Encounter: Payer: Self-pay | Admitting: Obstetrics & Gynecology

## 2019-07-07 ENCOUNTER — Other Ambulatory Visit: Payer: Self-pay

## 2019-07-07 ENCOUNTER — Ambulatory Visit (INDEPENDENT_AMBULATORY_CARE_PROVIDER_SITE_OTHER): Payer: 59 | Admitting: Obstetrics & Gynecology

## 2019-07-07 VITALS — BP 118/70 | HR 76 | Temp 97.5°F | Resp 12 | Ht 64.5 in | Wt 154.6 lb

## 2019-07-07 DIAGNOSIS — Z01419 Encounter for gynecological examination (general) (routine) without abnormal findings: Secondary | ICD-10-CM

## 2019-07-07 MED ORDER — CYCLOBENZAPRINE HCL 10 MG PO TABS
10.0000 mg | ORAL_TABLET | Freq: Three times a day (TID) | ORAL | 1 refills | Status: DC | PRN
Start: 2019-07-07 — End: 2019-12-21

## 2019-07-07 MED ORDER — SUMATRIPTAN SUCCINATE 100 MG PO TABS: ORAL_TABLET | ORAL | 4 refills | Status: DC

## 2019-07-07 MED ORDER — YASMIN 28 3-0.03 MG PO TABS: 1.0000 | ORAL_TABLET | Freq: Every day | ORAL | 4 refills | Status: DC

## 2019-07-07 MED ORDER — FLUOXETINE HCL 20 MG PO TABS: 10.0000 mg | ORAL_TABLET | Freq: Every day | ORAL | 4 refills | Status: DC

## 2019-07-07 NOTE — Progress Notes (Signed)
29 y.o. G0P0000 Married White or Caucasian female here for annual exam.  Working from home.  Husband was laid off from school.  He is currently looking for work.  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    Cycles are regular.  On OCPs.  Currently not contemplating pregnancy.    Patient's last menstrual period was 06/19/2019 (within days).          Sexually active: Yes.    The current method of family planning is OCP (estrogen/progesterone).    Exercising: No.  The patient does not participate in regular exercise at present. Smoker:  no  Health Maintenance: Pap:   06/23/18 Neg:Neg HR HPV  06/10/17 Neg. HR HPV:neg              10/25/15 ASCUS. HR HPV:neg  History of abnormal Pap:  Yes, 2017 TDaP:  10/2015 Screening Labs: none   reports that she has never smoked. She has never used smokeless tobacco. She reports previous alcohol use. She reports that she does not use drugs.  Past Medical History:  Diagnosis Date  . Abnormal Pap smear of cervix 01/2015   ASCUS. HR HPV:+  . Allergy   . Anxiety   . Migraine   . Muscle spasm     Past Surgical History:  Procedure Laterality Date  . HERNIA REPAIR     as infant  . TYMPANOSTOMY TUBE PLACEMENT     as child    Current Outpatient Medications  Medication Sig Dispense Refill  . cyclobenzaprine (FLEXERIL) 10 MG tablet Take 1 tablet (10 mg total) by mouth 3 (three) times daily as needed for muscle spasms. 30 tablet 2  . diphenhydrAMINE (BENADRYL) 25 MG tablet Take 25 mg by mouth every 6 (six) hours as needed for itching. Reported on 10/14/2015    . fexofenadine (ALLEGRA) 180 MG tablet Take 180 mg by mouth as needed. Reported on 10/14/2015    . FLUoxetine (PROZAC) 10 MG tablet TAKE 1 TABLET BY MOUTH EVERY DAY 90 tablet 3  . SUMAtriptan (IMITREX) 100 MG tablet TAKE 1 TABLET BY MOUTH EVERY 2 HOURS AS NEEDED FOR MIGRAINE. MAY REPEAT IN 2 HOURS IF HEADACHE PERSISTS OR RECURS. 12 tablet 0  . YASMIN 28 3-0.03 MG tablet TAKE 1 TABLET BY MOUTH DAILY. 3  Package 0   No current facility-administered medications for this visit.    Family History  Problem Relation Age of Onset  . Migraines Mother   . Hyperlipidemia Mother   . Diabetes Father   . Hypertension Father   . Hyperlipidemia Father   . Diabetes Maternal Grandmother   . Stroke Maternal Grandmother   . Dementia Maternal Grandmother   . Heart attack Maternal Grandfather   . Diabetes Paternal Grandmother   . Diabetes Other        maternal great grandmother  . Thyroid disease Other        maternal side    Review of Systems  All other systems reviewed and are negative.   Exam:   BP 118/70 (BP Location: Right Arm, Patient Position: Sitting, Cuff Size: Normal)   Pulse 76   Temp (!) 97.5 F (36.4 C) (Temporal)   Resp 12   Ht 5' 4.5" (1.638 m)   Wt 154 lb 9.6 oz (70.1 kg)   LMP 06/19/2019 (Within Days)   BMI 26.13 kg/m  Height: 5' 4.5" (163.8 cm)  Ht Readings from Last 3 Encounters:  07/07/19 5' 4.5" (1.638 m)  06/23/18 5' 3.75" (1.619 m)  06/10/17 5' 4.5" (1.638 m)   General appearance: alert, cooperative and appears stated age Head: Normocephalic, without obvious abnormality, atraumatic Neck: no adenopathy, supple, symmetrical, trachea midline and thyroid normal to inspection and palpation Lungs: clear to auscultation bilaterally Breasts: normal appearance, no masses or tenderness Heart: regular rate and rhythm Abdomen: soft, non-tender; bowel sounds normal; no masses,  no organomegaly Extremities: extremities normal, atraumatic, no cyanosis or edema Skin: Skin color, texture, turgor normal. No rashes or lesions Lymph nodes: Cervical, supraclavicular, and axillary nodes normal. No abnormal inguinal nodes palpated Neurologic: Grossly normal  Pelvic: External genitalia:  no lesions              Urethra:  normal appearing urethra with no masses, tenderness or lesions              Bartholins and Skenes: normal                 Vagina: normal appearing vagina with  normal color and discharge, no lesions              Cervix: no lesions              Pap taken: No. Bimanual Exam:  Uterus:  normal size, contour, position, consistency, mobility, non-tender              Adnexa: normal adnexa and no mass, fullness, tenderness               Rectovaginal: Confirms               Anus:  normal sphincter tone, no lesions  Chaperone, Zenovia Jordan, CMA, was present for exam.  A:  Well Woman with normal exam On OCPs Anxiety Muscle spams  P:   Mammogram guidelines reviewed pap smear with HR HPV neg 06/2018 RX for Imitrex 100mg  po x 1, repeat 2 hours if needed.  #36/4RF Increased prozac to 20mg  daily.  #90/4RF Yasmin po q day, DAW.  #3 month supply/4RF Flexeril 10mg  tid prn.  #30/1RF Return annually or prn

## 2019-08-04 ENCOUNTER — Other Ambulatory Visit: Payer: Self-pay | Admitting: Obstetrics & Gynecology

## 2019-08-04 NOTE — Telephone Encounter (Signed)
Prescription was sent to pharamcy 07/07/19 #90 w/4 refills

## 2019-12-08 ENCOUNTER — Encounter: Payer: Self-pay | Admitting: Obstetrics & Gynecology

## 2019-12-12 ENCOUNTER — Telehealth: Payer: Self-pay

## 2019-12-12 NOTE — Telephone Encounter (Signed)
AEX 07/07/19 LMP- unsure of date  Spoke with pt. Pt states has taken 2 UPTs and both were positive last weekend. Pt states its a planned pregnancy. Denies taking OCPs the last 2 months.  Pt states unsure of LMP date, but missed period on 12/03/19. Denies vaginal bleeding, spotting. Feeling tired and hungrier, but overall feeling well.   Pt requesting appt for confirmation. Pt advised of OV with Dr Hyacinth Meeker for labs and discussion. Pt agreeable. Pt scheduled with Dr Hyacinth Meeker on 7/7 at 11 am. Pt verbalized understanding of date and time of appt. CPS Neg.   Routing to Dr Hyacinth Meeker for review.  Encounter closed.

## 2019-12-12 NOTE — Telephone Encounter (Signed)
Left message for pt to return call to triage RN. 

## 2019-12-12 NOTE — Telephone Encounter (Signed)
[name] Hey Dr Hyacinth Meeker! I took a pregnancy test and it was positive. Taking another one tomorrow morning but could I possibly come in sometime to super confirm?   Have a great weekend!   Renee Wilcox

## 2019-12-13 ENCOUNTER — Encounter: Payer: Self-pay | Admitting: Obstetrics & Gynecology

## 2019-12-13 ENCOUNTER — Other Ambulatory Visit: Payer: Self-pay

## 2019-12-13 ENCOUNTER — Telehealth: Payer: Self-pay

## 2019-12-13 ENCOUNTER — Ambulatory Visit (INDEPENDENT_AMBULATORY_CARE_PROVIDER_SITE_OTHER): Payer: Commercial Managed Care - PPO | Admitting: Obstetrics & Gynecology

## 2019-12-13 VITALS — BP 122/70 | HR 84 | Resp 12 | Ht 63.5 in | Wt 154.0 lb

## 2019-12-13 DIAGNOSIS — N926 Irregular menstruation, unspecified: Secondary | ICD-10-CM

## 2019-12-13 DIAGNOSIS — N912 Amenorrhea, unspecified: Secondary | ICD-10-CM

## 2019-12-13 LAB — POCT URINE PREGNANCY: Preg Test, Ur: POSITIVE — AB

## 2019-12-13 NOTE — Telephone Encounter (Signed)
Left message for pt to return call to triage RN. 

## 2019-12-13 NOTE — Telephone Encounter (Signed)
-----   Message from Jerene Bears, MD sent at 12/13/2019 11:44 AM EDT ----- Regarding: viability scan This pt is about 6 weeks.  She needs to return for viablity ultrasound next Thursday.  Please call to schedule.  Order placed.

## 2019-12-13 NOTE — Telephone Encounter (Signed)
Call to patient. Per DPR, OK to leave message on voicemail.  Left voicemail requesting a return call to [facility] to review benefits and schedule recommended Pelvic ultrasound with M. Suzanne Miller, MD 

## 2019-12-13 NOTE — Progress Notes (Signed)
29 y.o. G1P0000 Married White or Caucasian female here for pregnancy confirmation.  She is having some fatigue and breast tenderness/nipple tenderness.  They have been trying for pregnancy.    States blood type is A+.  Doesn't have card documenting this.  Tdap 10/25/2015.  Was vaccinated for Covid in April.  Patient's last menstrual period was 11/02/2019.Marland Kitchen  EGA by LMP is 5 /67 weeks.  Medications reviewed.  She is going to stop everything except her benadryl.  Advised to monitor closely for mood changes as has been on prozac for a few years.  Discussion:   No cats in the home.  Aware should not change kitty litter.  Had chicken pox.  Aware flu vaccine safe and recommended.   Fish/shellfish/mercury discuss.   Unpasteurized cheese/juices discussed.  Nitrites in foods disucssed.  Exercise and intercourse discussed.  Alcohol and caffeine intake discussed.  Assessment:  Amenorrhea with +UPT  Plan:  Return for viability scan in about 1 week at around 7 weeks Pt has appt scheduled for new ob at end of month at [facility].  Will need records transferred.  She does desire to have viability scan done here prior to that appt.  Will proceed with scheduling.  25 minutes total spent with pt

## 2019-12-14 NOTE — Telephone Encounter (Signed)
Spoke with patient regarding benefits for recommended ultrasound. Patient acknowledges understanding of information presented. Incoming call transferred to triage nurse, Judeth Cornfield, RN.

## 2019-12-14 NOTE — Telephone Encounter (Signed)
Spoke with pt. Pt given update on viability PUS appt that is scheduled for 7/15 at 4 pm with OV consult to follow with Dr Hyacinth Meeker. Pt agreeable and verbalized understanding of date and time of appt. Cancellation policy reviewed, pt agreeable.   Routing to Dr Hyacinth Meeker for review.  Encounter closed.

## 2019-12-21 ENCOUNTER — Other Ambulatory Visit: Payer: Self-pay

## 2019-12-21 ENCOUNTER — Ambulatory Visit (INDEPENDENT_AMBULATORY_CARE_PROVIDER_SITE_OTHER): Payer: Commercial Managed Care - PPO

## 2019-12-21 ENCOUNTER — Encounter: Payer: Self-pay | Admitting: Obstetrics & Gynecology

## 2019-12-21 ENCOUNTER — Ambulatory Visit (INDEPENDENT_AMBULATORY_CARE_PROVIDER_SITE_OTHER): Payer: Commercial Managed Care - PPO | Admitting: Obstetrics & Gynecology

## 2019-12-21 VITALS — BP 108/64 | HR 68 | Resp 16 | Wt 154.0 lb

## 2019-12-21 DIAGNOSIS — N912 Amenorrhea, unspecified: Secondary | ICD-10-CM | POA: Diagnosis not present

## 2019-12-21 DIAGNOSIS — Z3491 Encounter for supervision of normal pregnancy, unspecified, first trimester: Secondary | ICD-10-CM

## 2019-12-21 DIAGNOSIS — Z3201 Encounter for pregnancy test, result positive: Secondary | ICD-10-CM

## 2019-12-21 DIAGNOSIS — Z3687 Encounter for antenatal screening for uncertain dates: Secondary | ICD-10-CM

## 2019-12-21 DIAGNOSIS — N926 Irregular menstruation, unspecified: Secondary | ICD-10-CM

## 2019-12-21 NOTE — Progress Notes (Signed)
29 y.o. G1P0000 Married White or Caucasian female here for viability ultrasound.  She is accompanied by her husband.  Having some mild nausea.  Also having breast tenderness.  Denies vaginal bleeding.  Patient's last menstrual period was 11/02/2019.Marland Kitchen  EGA by LMP is 7 0/7 weeks.  Scheduled Meds:  PNV  Findings:  UTERUS: Gestational sac:  present, yolk sac:  present,  Fetal pole present.  CRL 0.48cm.   Fetal cardiac activity present: 100 BPM ADNEXA: Left ovary: 2.67cm       Right ovary: 3.57cm with 2.2cm corpus luteum CUL DE SAC: small amount of free fluid in PCDS noted  Discussion:  Findings reviewed.  Dating is not consistent with dating by LMP.  [facility] 08/14/2020 by this ultrasound which puts dating at 6 1/7 week..    Reviewed infectious disease risks with pt and spouse.  They are aware she is not do change kitty litter.  Tdap 10/25/15.  Pt has been vaccinated for Covid 19.  She dad chicken pox as a child.  Aware flu vaccine safe and recommended.   Fish/shellfish/mercury discuss.  Patient does not eat fish.  Unpasteurized cheese/juices discussed.  Nitrites in foods disucssed.  Exercise and intercourse discussed.  Fetal DNA particle testing discussed as well as first trimester down's testing discussed.  Questions answered.    Assessment:  First trimester singleton IUD that is earlier by ultrasound dating at 54 1/7 weeks than by LMP which would be at 7/0 weeks.  Pt aware dating is changed.    Plan:  Pt already has appt with Dr. Langston Masker at [facility].  Instructions given for calling with pain, bleeding prior to new ob appt or for any other questions.  She was wished well.  About 25 minutes spent in total with pt and spouse.

## 2020-01-22 ENCOUNTER — Encounter: Payer: Self-pay | Admitting: Obstetrics & Gynecology

## 2020-01-23 ENCOUNTER — Telehealth: Payer: Self-pay | Admitting: *Deleted

## 2020-01-23 NOTE — Telephone Encounter (Signed)
[name] Hey Dr Hyacinth Meeker! I have a question. Given the variants present for COVID-19 would you consider me at a higher risk since Im pregnant? 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    Reviewed 12/21/19 OV notes, patient scheduled with Dr. Langston Masker at [facility].  Call placed to patient, left detailed message, ok per dpr. Advised patient to f/u with [facility]/OB provider to review current Covid 19 recommendations during her pregnancy. Return call to office if any additional questions. Will update Dr. Hyacinth Meeker, our office will return call if any additional recommendations.   Routing to provider for final review.  Will close encounter.

## 2020-01-23 NOTE — Telephone Encounter (Signed)
See telephone encounter dated 01/23/20.   Encounter closed.

## 2020-08-10 ENCOUNTER — Other Ambulatory Visit: Payer: Self-pay | Admitting: Obstetrics & Gynecology

## 2020-10-01 ENCOUNTER — Ambulatory Visit: Payer: 59

## 2023-07-05 ENCOUNTER — Ambulatory Visit
Admission: EM | Admit: 2023-07-05 | Discharge: 2023-07-05 | Disposition: A | Discharge status: Home / Self Care | Payer: BC Managed Care – PPO | Attending: Nurse Practitioner | Admitting: Nurse Practitioner

## 2023-07-05 DIAGNOSIS — J209 Acute bronchitis, unspecified: Secondary | ICD-10-CM

## 2023-07-05 LAB — POC COVID19/FLU A&B COMBO
Covid Antigen, POC: NEGATIVE
Influenza A Antigen, POC: NEGATIVE
Influenza B Antigen, POC: NEGATIVE

## 2023-07-05 MED ORDER — ALBUTEROL SULFATE HFA 108 (90 BASE) MCG/ACT IN AERS: 2.0000 | INHALATION_SPRAY | Freq: Four times a day (QID) | RESPIRATORY_TRACT | 0 refills | Status: AC | PRN

## 2023-07-05 MED ORDER — PREDNISONE 20 MG PO TABS: 40.0000 mg | ORAL_TABLET | Freq: Every day | ORAL | 0 refills | Status: AC

## 2023-07-05 MED ORDER — IPRATROPIUM-ALBUTEROL 0.5-2.5 (3) MG/3ML IN SOLN
3.0000 mL | Freq: Once | RESPIRATORY_TRACT | Status: AC
  Administered 2023-07-05: 3 mL via RESPIRATORY_TRACT

## 2023-07-05 MED ORDER — PROMETHAZINE-DM 6.25-15 MG/5ML PO SYRP: 5.0000 mL | ORAL_SOLUTION | Freq: Four times a day (QID) | ORAL | 0 refills | Status: AC | PRN

## 2023-07-05 MED ORDER — DEXAMETHASONE SODIUM PHOSPHATE 10 MG/ML IJ SOLN
10.0000 mg | INTRAMUSCULAR | Status: AC
  Administered 2023-07-05: 10 mg via INTRAMUSCULAR

## 2023-07-05 NOTE — ED Provider Notes (Signed)
[facility]    CSN: [id number] Arrival date & time: 07/05/23  0848      History   Chief Complaint Chief Complaint  Patient presents with   Cough    HPI Renee Wilcox is a 33 y.o. female.   The history is provided by the patient.   Patient with a 1 week history of cough, chest tightness, sore throat, nasal congestion, and fever.  States that she does that she has been wheezing with some shortness of breath.  Tmax 101.  Denies headache, ear pain, ear drainage, abdominal pain, nausea, vomiting, diarrhea, or rash.  Patient denies history of smoking or asthma.  States she has been taking over-the-counter Tylenol and Robitussin for her symptoms with minimal relief.  Past Medical History:  Diagnosis Date   Abnormal Pap smear of cervix 01/2015   ASCUS. HR HPV:+   Allergy    Anxiety    Migraine    Muscle spasm     Patient Active Problem List   Diagnosis Date Noted   Onychomycosis 04/12/2015   Faintness 02/22/2015   Dizziness 02/22/2015   Seizure (HCC) 02/15/2015   Palpitations 02/15/2015   Allergic rhinitis 02/15/2015   Anxiety state 02/15/2015   Abdominal pain, epigastric 02/15/2015   Syncopal episodes 02/04/2015   Muscle spasms of neck 01/26/2014    Past Surgical History:  Procedure Laterality Date   HERNIA REPAIR     as infant   TYMPANOSTOMY TUBE PLACEMENT     as child    OB History     Gravida  1   Para  0   Term  0   Preterm  0   AB  0   Living  0      SAB  0   IAB  0   Ectopic  0   Multiple  0   Live Births               Home Medications    Prior to Admission medications   Medication Sig Start Date End Date Taking? Authorizing Provider  albuterol (VENTOLIN HFA) 108 (90 Base) MCG/ACT inhaler Inhale 2 puffs into the lungs every 6 (six) hours as needed for wheezing or shortness of breath. 07/05/23  Yes Leath-Warren, Sadie Haber, NP  FLUoxetine (PROZAC) 20 MG tablet Take 20 mg by mouth daily. 06/26/23  Yes [provider]  predniSONE (DELTASONE) 20 MG tablet Take 2 tablets (40 mg total) by mouth daily with breakfast for 5 days. 07/05/23 07/10/23 Yes Leath-Warren, Sadie Haber, NP  promethazine-dextromethorphan (PROMETHAZINE-DM) 6.25-15 MG/5ML syrup Take 5 mLs by mouth 4 (four) times daily as needed. 07/05/23  Yes Leath-Warren, Sadie Haber, NP  diphenhydrAMINE (BENADRYL) 25 MG tablet Take 25 mg by mouth every 6 (six) hours as needed for itching. Reported on 10/14/2015    [provider]  Prenatal Vit-Fe Fumarate-FA (PRENATAL VITAMIN PO) Take by mouth.    [provider]    Family History Family History  Problem Relation Age of Onset   Migraines Mother    Hyperlipidemia Mother    Diabetes Father    Hypertension Father    Hyperlipidemia Father    Diabetes Maternal Grandmother    Stroke Maternal Grandmother    Dementia Maternal Grandmother    Heart attack Maternal Grandfather    Diabetes Paternal Grandmother    Diabetes Other        maternal great grandmother   Thyroid disease Other        maternal  side    Social History Social History   Tobacco Use   Smoking status: Never   Smokeless tobacco: Never  Vaping Use   Vaping status: Never Used  Substance Use Topics   Alcohol use: Not Currently    Alcohol/week: 0.0 standard drinks of alcohol   Drug use: No     Allergies   Patient has no known allergies.   Review of Systems Review of Systems Per HPI  Physical Exam Triage Vital Signs ED Triage Vitals  Encounter Vitals Group     BP 07/05/23 0934 103/66     Systolic BP Percentile --      Diastolic BP Percentile --      Pulse Rate 07/05/23 0934 93     Resp 07/05/23 0934 16     Temp 07/05/23 0934 99.5 F (37.5 C)     Temp Source 07/05/23 0934 Oral     SpO2 07/05/23 0934 93 %     Weight --      Height --      Head Circumference --      Peak Flow --      Pain Score 07/05/23 0935 7     Pain Loc --      Pain Education --      Exclude from Growth Chart --    No  data found.  Updated Vital Signs BP 103/66 (BP Location: Right Arm)   Pulse 93   Temp 99.5 F (37.5 C) (Oral)   Resp 16   LMP 06/11/2023 (Approximate)   SpO2 98%   Breastfeeding No   Visual Acuity Right Eye Distance:   Left Eye Distance:   Bilateral Distance:    Right Eye Near:   Left Eye Near:    Bilateral Near:     Physical Exam Vitals and nursing note reviewed.  Constitutional:      General: She is not in acute distress.    Appearance: Normal appearance.  HENT:     Head: Normocephalic.     Right Ear: Tympanic membrane, ear canal and external ear normal.     Left Ear: Tympanic membrane, ear canal and external ear normal.     Mouth/Throat:     Mouth: Mucous membranes are moist.  Eyes:     Extraocular Movements: Extraocular movements intact.     Conjunctiva/sclera: Conjunctivae normal.     Pupils: Pupils are equal, round, and reactive to light.  Cardiovascular:     Rate and Rhythm: Normal rate and regular rhythm.     Pulses: Normal pulses.     Heart sounds: Normal heart sounds.  Pulmonary:     Effort: Pulmonary effort is normal. No respiratory distress.     Breath sounds: No stridor. Examination of the right-upper field reveals decreased breath sounds and rhonchi. Examination of the left-upper field reveals decreased breath sounds. Examination of the right-lower field reveals decreased breath sounds and rhonchi. Examination of the left-lower field reveals decreased breath sounds and rhonchi. Decreased breath sounds, wheezing and rhonchi present. No rales.     Comments: DuoNeb administered, post DuoNeb, patient with decreased wheezing and rhonchi noted throughout. Abdominal:     General: Bowel sounds are normal.     Palpations: Abdomen is soft.     Tenderness: There is no abdominal tenderness.  Musculoskeletal:     Cervical back: Normal range of motion.  Lymphadenopathy:     Cervical: No cervical adenopathy.  Skin:    General: Skin is warm and dry.  Neurological:  General: No focal deficit present.     Mental Status: She is alert and oriented to person, place, and time.  Psychiatric:        Mood and Affect: Mood normal.        Behavior: Behavior normal.      UC Treatments / Results  Labs (all labs ordered are listed, but only abnormal results are displayed) Labs Reviewed  POC COVID19/FLU A&B COMBO - Normal    EKG   Radiology No results found.  Procedures Procedures (including critical care time)  Medications Ordered in UC Medications  dexamethasone (DECADRON) injection 10 mg (has no administration in time range)  ipratropium-albuterol (DUONEB) 0.5-2.5 (3) MG/3ML nebulizer solution 3 mL (3 mLs Nebulization Given 07/05/23 1008)    Initial Impression / Assessment and Plan / UC Course  I have reviewed the triage vital signs and the nursing notes.  Pertinent labs & imaging results that were available during my care of the patient were reviewed by me and considered in my medical decision making (see chart for details).  The COVID/flu was negative.  Symptoms consistent with acute bronchitis.  Decreased wheezing and rhonchi noted post DuoNeb.  Decadron 10 mg IM also administered for bronchial inflammation.  Will treat with prednisone 40 mg for the next 5 days, albuterol inhaler, and Promethazine DM for cough.  Supportive care recommendations were provided and discussed with the patient to include fluids, rest, over-the-counter analgesics, use of a humidifier at nighttime during sleep, and sleeping elevated on pillows while symptoms persist.  Discussed indications with the patient regarding follow-up.  Patient was in agreement with this plan of care and verbalizes understanding.  All questions were answered.  Patient stable for discharge.  Final Clinical Impressions(s) / UC Diagnoses   Final diagnoses:  Acute bronchitis, unspecified organism     Discharge Instructions      You COVID/flu test was negative.  Your symptoms are  consistent with bronchitis. You were given a DuoNeb nebulizer treatment, and an injection of Decadron 10 mg.  Start prednisone on 07/06/2023. Take medication as prescribed. Increase fluids and allow for plenty of rest. May take over-the-counter Tylenol or ibuprofen as needed for pain, fever, general discomfort. Recommend using a humidifier in your bedroom at nighttime during sleep and sleeping elevated on pillows while cough symptoms persist. Please be advised that your cough may last for several weeks.  If you are generally feeling well, but had a nagging, persistent cough, continue over-the-counter cough medications, increasing your fluids, and cough drops.  If you experience fever, difficulty breathing, or other concerns, you may follow-up in this clinic or with your primary care physician for further evaluation. Follow-up as needed.     ED Prescriptions     Medication Sig Dispense Auth. Provider   albuterol (VENTOLIN HFA) 108 (90 Base) MCG/ACT inhaler Inhale 2 puffs into the lungs every 6 (six) hours as needed for wheezing or shortness of breath. 1 each Leath-Warren, Sadie Haber, NP   predniSONE (DELTASONE) 20 MG tablet Take 2 tablets (40 mg total) by mouth daily with breakfast for 5 days. 10 tablet Leath-Warren, Sadie Haber, NP   promethazine-dextromethorphan (PROMETHAZINE-DM) 6.25-15 MG/5ML syrup Take 5 mLs by mouth 4 (four) times daily as needed. 118 mL Leath-Warren, Sadie Haber, NP      PDMP not reviewed this encounter.   Abran Cantor, NP 07/05/23 1027

## 2023-07-05 NOTE — Discharge Instructions (Addendum)
You COVID/flu test was negative.  Your symptoms are consistent with bronchitis. You were given a DuoNeb nebulizer treatment, and an injection of Decadron 10 mg.  Start prednisone on 07/06/2023. Take medication as prescribed. Increase fluids and allow for plenty of rest. May take over-the-counter Tylenol or ibuprofen as needed for pain, fever, general discomfort. Recommend using a humidifier in your bedroom at nighttime during sleep and sleeping elevated on pillows while cough symptoms persist. Please be advised that your cough may last for several weeks.  If you are generally feeling well, but had a nagging, persistent cough, continue over-the-counter cough medications, increasing your fluids, and cough drops.  If you experience fever, difficulty breathing, or other concerns, you may follow-up in this clinic or with your primary care physician for further evaluation. Follow-up as needed.

## 2023-07-05 NOTE — ED Triage Notes (Signed)
Cough, fever 101, chest tightness/soreness, sore throat, congestion x 1 week. Taking tylenol and robitussin.
